# Patient Record
Sex: Male | Born: 1945 | Race: White | Hispanic: No | Marital: Married | Smoking: Never smoker
Health system: Southern US, Community
[De-identification: ages and names within clinical notes are randomized; demographics above are authoritative.]

## PROBLEM LIST (undated history)

## (undated) DIAGNOSIS — F0390 Unspecified dementia without behavioral disturbance: Secondary | ICD-10-CM

## (undated) DIAGNOSIS — Z0000 Encounter for general adult medical examination without abnormal findings: Secondary | ICD-10-CM

## (undated) DIAGNOSIS — E785 Hyperlipidemia, unspecified: Secondary | ICD-10-CM

## (undated) DIAGNOSIS — I10 Essential (primary) hypertension: Secondary | ICD-10-CM

## (undated) DIAGNOSIS — L4050 Arthropathic psoriasis, unspecified: Secondary | ICD-10-CM

## (undated) HISTORY — DX: Hyperlipidemia, unspecified: E78.5

## (undated) HISTORY — DX: Arthropathic psoriasis, unspecified: L40.50

## (undated) HISTORY — DX: Essential (primary) hypertension: I10

## (undated) HISTORY — DX: Encounter for general adult medical examination without abnormal findings: Z00.00

## (undated) HISTORY — PX: NO PAST SURGERIES: SHX2092

## (undated) HISTORY — PX: CATARACT EXTRACTION, BILATERAL: SHX1313

---

## 1997-05-20 ENCOUNTER — Ambulatory Visit (HOSPITAL_COMMUNITY): Admission: RE | Admit: 1997-05-20 | Discharge: 1997-05-20 | Payer: Self-pay | Admitting: Family Medicine

## 2000-10-04 ENCOUNTER — Other Ambulatory Visit: Admission: RE | Admit: 2000-10-04 | Discharge: 2000-10-04 | Payer: Self-pay | Admitting: Urology

## 2000-10-29 ENCOUNTER — Encounter (INDEPENDENT_AMBULATORY_CARE_PROVIDER_SITE_OTHER): Payer: Self-pay

## 2000-10-29 ENCOUNTER — Ambulatory Visit (HOSPITAL_BASED_OUTPATIENT_CLINIC_OR_DEPARTMENT_OTHER): Admission: RE | Admit: 2000-10-29 | Discharge: 2000-10-29 | Payer: Self-pay | Admitting: Urology

## 2002-01-13 ENCOUNTER — Encounter: Admission: RE | Admit: 2002-01-13 | Discharge: 2002-01-13 | Payer: Self-pay | Admitting: Otolaryngology

## 2002-01-13 ENCOUNTER — Encounter: Payer: Self-pay | Admitting: Otolaryngology

## 2009-05-12 ENCOUNTER — Emergency Department (HOSPITAL_COMMUNITY): Admission: EM | Admit: 2009-05-12 | Discharge: 2009-05-12 | Payer: Self-pay | Admitting: Emergency Medicine

## 2010-05-19 NOTE — Op Note (Signed)
[facility]  Patient:    Douglas Reynolds, Douglas Reynolds Visit Number: [id number] MRN: [id number]          Service Type: NES Location: [facility] Attending Physician:  Evlyn Clines Dictated by:   Excell Seltzer. Annabell Howells, M.D. Proc. Date: 10/29/00 Admit Date:  10/29/2000   CC:         Gloriajean Dell. Andrey Campanile, M.D.                           Operative Report  PREOPERATIVE DIAGNOSIS:  Microhematuria with positive cytologies.  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  PROCEDURE:  Cystoscopy with bilateral retrograde pyelograms with interpretation, right and left renal pelvic washings for cytology, _______ bladder cytology, random cup bladder and prostatic urethral biopsies with fulguration.  SURGEON:  Excell Seltzer. Annabell Howells, M.D.  ANESTHESIA:  General.  SPECIMEN:  Washings from the right and left renal pelvis and bladder, cup biopsies from the bladder wall dome, posterior wall, right and left lateral walls, and trigone.  Cup biopsies from the prostatic urethra.  COMPLICATIONS:  None.  INDICATIONS:  The patient is a 65 year old white male who originally was sent for microhematuria.  An office workup with IVP and cystoscopy were negative, but he had two cytologies, one with highly atypical cells suspicious for urothelial carcinoma.  The other one was positive for malignant transitional cells.  It was felt that a more thorough evaluation was indicated.  FINDINGS AND DESCRIPTION OF PROCEDURE:  The patient was taken to the operating room.  He received Tequin preoperatively.  General anesthetic was induced.  He was placed in the lithotomy position.  His perineum and genitalia were prepped with Betadine solution.  He was draped in the usual sterile fashion. Cystoscopy was performed using a 22-French scope, and the 12 and 70 degree lenses.  Examination revealed a normal anterior urethra.  In the bulb, there was a mild stricture without  sufficient constriction to impact flow.  The 22-French scope passed easily.  There was no evidence of neoplastic changes proximal or distal to the stricture.  The external sphincter was intact.  The prostatic urethra was short with bilobar hyperplasia without significant obstruction.  No mucosal abnormalities were noted in the prostatic urethra. The bladder wall was smooth and pale without tumors, stones, or inflammation. The ureteral orifices were in their normal anatomic position effluxing clear urine.  The right ureteral orifice was cannulated with a 5-French open-ended catheter.  Contrast was instilled in a retrograde fashion.  This retrograde demonstrated no abnormalities of the ureter or intrarenal collecting system.  After completion of the retrograde, the 5-French catheter was advanced to the renal pelvis.  The contrast was rinsed from the renal pelvis and normal saline was used to perform washings.  Approximately 25-30 cc of fluid was retrieved. The catheter used for the right washings and retrograde along with the syringe was discarded.  A fresh catheter was used on the left where a retrograde pyelogram was performed.  This study revealed a normal ureter and intrarenal collecting system without filling defects or other abnormalities.  The 5-French catheter was advanced into the renal pelvis, and the contrast was irrigated from the renal pelvis, and then washings were performed with normal saline, approximately 20-25 cc of fluid was retrieved for cytology.  Once the left side studies had been completed, that catheter and syringe were discarded.  I then rinsed out the bladder several times with normal saline, and  then performed bladder wash cytologies with normal saline.  At this point, the cup biopsy forceps was then introduced, and biopsies were obtained from the right and left lateral walls of the bladder, the posterior wall of the bladder, the dome of the bladder, and the  trigone.  These biopsy sites were fulgurated with the Bugbee electrode.  Prostatic urethral biopsies were then taken from the right and left prostatic lobes.  Those biopsy sites were also fulgurated.  At this point, final inspection revealed good hemostasis.  The bladder was drained.  The scope was removed and the patient was taken down from the lithotomy position.  His anesthetic was reversed.  He was moved to the recovery room in stable condition.  There were no complications. Dictated by:   Excell Seltzer. Annabell Howells, M.D. Attending Physician:  Evlyn Clines DD:  10/29/00 TD:  10/30/00 Job: [id number]

## 2011-03-27 ENCOUNTER — Ambulatory Visit (INDEPENDENT_AMBULATORY_CARE_PROVIDER_SITE_OTHER): Payer: Medicare Other | Admitting: Family Medicine

## 2011-03-27 ENCOUNTER — Encounter: Payer: Self-pay | Admitting: Family Medicine

## 2011-03-27 VITALS — BP 146/80 | Ht 70.0 in | Wt 190.0 lb

## 2011-03-27 DIAGNOSIS — L4050 Arthropathic psoriasis, unspecified: Secondary | ICD-10-CM

## 2011-03-27 DIAGNOSIS — S43429A Sprain of unspecified rotator cuff capsule, initial encounter: Secondary | ICD-10-CM

## 2011-03-27 DIAGNOSIS — E785 Hyperlipidemia, unspecified: Secondary | ICD-10-CM

## 2011-03-27 DIAGNOSIS — M75101 Unspecified rotator cuff tear or rupture of right shoulder, not specified as traumatic: Secondary | ICD-10-CM

## 2011-03-27 DIAGNOSIS — I10 Essential (primary) hypertension: Secondary | ICD-10-CM

## 2011-03-27 HISTORY — DX: Hyperlipidemia, unspecified: E78.5

## 2011-03-27 HISTORY — DX: Arthropathic psoriasis, unspecified: L40.50

## 2011-03-27 HISTORY — DX: Essential (primary) hypertension: I10

## 2011-03-27 NOTE — Progress Notes (Signed)
Patient Name: Douglas Reynolds Date of Birth: December 19, 1945 Medical Record Number: [id number] Gender: male Date of Encounter: 03/27/2011  History of Present Illness:  Douglas Reynolds is a 66 y.o. very pleasant male patient who presents with the following:  Dr. 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 At that time he was jostled very much, in his neck hurt for several days after this.  Subsequently he developed some right-sided shoulder pain a week or so after this incident. He is not clear whether or not he particularly jerky shoulder are not at the time, but his recollection is unclear due to the fall.  At this point he has quite significant lateral shoulder pain in a tee shirt distribution and a very significant lack of ability to abduct the shoulder. He also has loss of motion in multiple planes of movement.   Clinical history is also significant for a history of psoriatic arthritis, on Imuran, and taking prednisone 2.5 mg daily.  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  Patient Active Problem List  Diagnoses  . Psoriatic arthritis  . Hyperlipidemia  . Hypertension   Past Medical History  Diagnosis Date  . Psoriatic arthritis 03/27/2011  . Hyperlipidemia 03/27/2011  . Hypertension 03/27/2011   No past surgical history on file. History  Substance Use Topics  . Smoking status: Not on file  . Smokeless tobacco: Not on file  . Alcohol Use: Not on file   No family history on file. Allergies no known allergies  Medication list has been reviewed and updated.  Review of Systems:  GEN: No fevers, chills. Nontoxic. Primarily MSK c/o today. MSK: Detailed in the HPI GI: tolerating PO intake without difficulty Neuro: No numbness, parasthesias, or  tingling associated. Otherwise the pertinent positives of the ROS are noted above.    Physical Examination: Filed Vitals:   03/27/11 1355  BP: 146/80  Height: 5\' 10"  (1.778 m)  Weight: 190 lb (86.183 kg)    Body mass index is 27.26 kg/(m^2).   GEN: WDWN, NAD, Non-toxic, Alert & Oriented x 3 HEENT: Atraumatic, Normocephalic.  Ears and Nose: No external deformity. EXTR: No clubbing/cyanosis/edema NEURO: Normal gait.  PSYCH: Normally interactive. Conversant. Not depressed or anxious appearing.  Calm demeanor.   Cervical spine motion is full without inducible radiculopathy.  RIGHT shoulder: Nontender lung clavicle. Nontender at the a.c. Joint. Nontender in the bicipital groove.  Abduction is limited to 80. Flexion to 110. Internal and external rotation is also significantly limited, but not able to be evaluated fully at 90 abduction.  str testing is 4/5 in abd 5/5 in IROM and EROM  Drop test is negative. Other rotator cuff special testing is not possible.  Diagnostic Ultrasound Evalution: [employer] E, MSK ultrasound, MSK probe Anatomy scanned: R Shoulder Indication: Pain Findings: the biceps is intact on shorter long axis, and maintains its position in the bicipital groove. Subscapularis is intact without any evidence of tearing. There is no subcoracoid impingement. Supraspinatus evaluation shows a full-thickness tear in the body of the supraspinatus. The footprint appears to be intact, but there is evidence of a large tear in multiple views. Images are saved.   Assessment and Plan:  1. Rotator cuff tear, right   2. Psoriatic arthritis   3. Hyperlipidemia  4. Hypertension     Examination and ultrasound findings are consistent with a full-thickness supraspinatus tear.  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  We are going to consult Dr.  Dion Saucier for his expertise.

## 2011-03-27 NOTE — Patient Instructions (Signed)
You have been scheduled for an appointment with Eulah Pont and [facility] on 03/29/11 at 2:30 pm.  Their office is located at [address] [place] [address], phone number is [phone].

## 2011-03-29 ENCOUNTER — Other Ambulatory Visit: Payer: Self-pay | Admitting: Orthopedic Surgery

## 2011-03-29 DIAGNOSIS — M25511 Pain in right shoulder: Secondary | ICD-10-CM

## 2011-04-09 ENCOUNTER — Ambulatory Visit
Admission: RE | Admit: 2011-04-09 | Discharge: 2011-04-09 | Disposition: A | Discharge status: Home / Self Care | Payer: Medicare Other | Source: Ambulatory Visit | Attending: Orthopedic Surgery | Admitting: Orthopedic Surgery

## 2011-04-09 DIAGNOSIS — M25511 Pain in right shoulder: Secondary | ICD-10-CM

## 2015-01-28 DIAGNOSIS — M8580 Other specified disorders of bone density and structure, unspecified site: Secondary | ICD-10-CM | POA: Diagnosis not present

## 2015-01-28 DIAGNOSIS — M5136 Other intervertebral disc degeneration, lumbar region: Secondary | ICD-10-CM | POA: Diagnosis not present

## 2015-01-28 DIAGNOSIS — L4050 Arthropathic psoriasis, unspecified: Secondary | ICD-10-CM | POA: Diagnosis not present

## 2015-01-28 DIAGNOSIS — M25569 Pain in unspecified knee: Secondary | ICD-10-CM | POA: Diagnosis not present

## 2015-01-28 DIAGNOSIS — L401 Generalized pustular psoriasis: Secondary | ICD-10-CM | POA: Diagnosis not present

## 2015-04-28 DIAGNOSIS — L4050 Arthropathic psoriasis, unspecified: Secondary | ICD-10-CM | POA: Diagnosis not present

## 2015-04-28 DIAGNOSIS — M5136 Other intervertebral disc degeneration, lumbar region: Secondary | ICD-10-CM | POA: Diagnosis not present

## 2015-04-28 DIAGNOSIS — L401 Generalized pustular psoriasis: Secondary | ICD-10-CM | POA: Diagnosis not present

## 2015-04-28 DIAGNOSIS — M8580 Other specified disorders of bone density and structure, unspecified site: Secondary | ICD-10-CM | POA: Diagnosis not present

## 2015-07-21 DIAGNOSIS — X32XXXD Exposure to sunlight, subsequent encounter: Secondary | ICD-10-CM | POA: Diagnosis not present

## 2015-07-21 DIAGNOSIS — C44529 Squamous cell carcinoma of skin of other part of trunk: Secondary | ICD-10-CM | POA: Diagnosis not present

## 2015-07-21 DIAGNOSIS — D225 Melanocytic nevi of trunk: Secondary | ICD-10-CM | POA: Diagnosis not present

## 2015-07-21 DIAGNOSIS — C44622 Squamous cell carcinoma of skin of right upper limb, including shoulder: Secondary | ICD-10-CM | POA: Diagnosis not present

## 2015-07-21 DIAGNOSIS — L570 Actinic keratosis: Secondary | ICD-10-CM | POA: Diagnosis not present

## 2015-07-28 DIAGNOSIS — M8580 Other specified disorders of bone density and structure, unspecified site: Secondary | ICD-10-CM | POA: Diagnosis not present

## 2015-07-28 DIAGNOSIS — L4050 Arthropathic psoriasis, unspecified: Secondary | ICD-10-CM | POA: Diagnosis not present

## 2015-07-28 DIAGNOSIS — L401 Generalized pustular psoriasis: Secondary | ICD-10-CM | POA: Diagnosis not present

## 2015-07-28 DIAGNOSIS — M5136 Other intervertebral disc degeneration, lumbar region: Secondary | ICD-10-CM | POA: Diagnosis not present

## 2015-08-04 DIAGNOSIS — R5383 Other fatigue: Secondary | ICD-10-CM | POA: Diagnosis not present

## 2015-08-04 DIAGNOSIS — R5381 Other malaise: Secondary | ICD-10-CM | POA: Diagnosis not present

## 2015-08-04 DIAGNOSIS — E7800 Pure hypercholesterolemia, unspecified: Secondary | ICD-10-CM | POA: Diagnosis not present

## 2015-08-04 DIAGNOSIS — W57XXXA Bitten or stung by nonvenomous insect and other nonvenomous arthropods, initial encounter: Secondary | ICD-10-CM | POA: Diagnosis not present

## 2015-08-04 DIAGNOSIS — R51 Headache: Secondary | ICD-10-CM | POA: Diagnosis not present

## 2015-08-04 DIAGNOSIS — I10 Essential (primary) hypertension: Secondary | ICD-10-CM | POA: Diagnosis not present

## 2015-08-04 DIAGNOSIS — M2550 Pain in unspecified joint: Secondary | ICD-10-CM | POA: Diagnosis not present

## 2015-08-04 DIAGNOSIS — T148 Other injury of unspecified body region: Secondary | ICD-10-CM | POA: Diagnosis not present

## 2015-08-08 DIAGNOSIS — Z08 Encounter for follow-up examination after completed treatment for malignant neoplasm: Secondary | ICD-10-CM | POA: Diagnosis not present

## 2015-08-08 DIAGNOSIS — X32XXXD Exposure to sunlight, subsequent encounter: Secondary | ICD-10-CM | POA: Diagnosis not present

## 2015-08-08 DIAGNOSIS — C44529 Squamous cell carcinoma of skin of other part of trunk: Secondary | ICD-10-CM | POA: Diagnosis not present

## 2015-08-08 DIAGNOSIS — B078 Other viral warts: Secondary | ICD-10-CM | POA: Diagnosis not present

## 2015-08-08 DIAGNOSIS — L570 Actinic keratosis: Secondary | ICD-10-CM | POA: Diagnosis not present

## 2015-08-08 DIAGNOSIS — Z85828 Personal history of other malignant neoplasm of skin: Secondary | ICD-10-CM | POA: Diagnosis not present

## 2015-08-17 DIAGNOSIS — C801 Malignant (primary) neoplasm, unspecified: Secondary | ICD-10-CM | POA: Diagnosis not present

## 2015-08-17 DIAGNOSIS — R5383 Other fatigue: Secondary | ICD-10-CM | POA: Diagnosis not present

## 2015-08-17 DIAGNOSIS — R5381 Other malaise: Secondary | ICD-10-CM | POA: Diagnosis not present

## 2015-08-17 DIAGNOSIS — C44529 Squamous cell carcinoma of skin of other part of trunk: Secondary | ICD-10-CM | POA: Diagnosis not present

## 2015-09-08 DIAGNOSIS — L570 Actinic keratosis: Secondary | ICD-10-CM | POA: Diagnosis not present

## 2015-09-08 DIAGNOSIS — X32XXXD Exposure to sunlight, subsequent encounter: Secondary | ICD-10-CM | POA: Diagnosis not present

## 2015-09-08 DIAGNOSIS — Z85828 Personal history of other malignant neoplasm of skin: Secondary | ICD-10-CM | POA: Diagnosis not present

## 2015-09-08 DIAGNOSIS — Z08 Encounter for follow-up examination after completed treatment for malignant neoplasm: Secondary | ICD-10-CM | POA: Diagnosis not present

## 2015-09-26 DIAGNOSIS — L4052 Psoriatic arthritis mutilans: Secondary | ICD-10-CM | POA: Diagnosis not present

## 2015-09-26 DIAGNOSIS — E785 Hyperlipidemia, unspecified: Secondary | ICD-10-CM | POA: Diagnosis not present

## 2015-09-26 DIAGNOSIS — Z23 Encounter for immunization: Secondary | ICD-10-CM | POA: Diagnosis not present

## 2015-09-26 DIAGNOSIS — I10 Essential (primary) hypertension: Secondary | ICD-10-CM | POA: Diagnosis not present

## 2015-10-28 DIAGNOSIS — L4050 Arthropathic psoriasis, unspecified: Secondary | ICD-10-CM | POA: Diagnosis not present

## 2015-10-28 DIAGNOSIS — M5136 Other intervertebral disc degeneration, lumbar region: Secondary | ICD-10-CM | POA: Diagnosis not present

## 2015-10-28 DIAGNOSIS — L401 Generalized pustular psoriasis: Secondary | ICD-10-CM | POA: Diagnosis not present

## 2015-10-28 DIAGNOSIS — M8580 Other specified disorders of bone density and structure, unspecified site: Secondary | ICD-10-CM | POA: Diagnosis not present

## 2015-11-10 DIAGNOSIS — Z85828 Personal history of other malignant neoplasm of skin: Secondary | ICD-10-CM | POA: Diagnosis not present

## 2015-11-10 DIAGNOSIS — C44622 Squamous cell carcinoma of skin of right upper limb, including shoulder: Secondary | ICD-10-CM | POA: Diagnosis not present

## 2015-11-10 DIAGNOSIS — X32XXXD Exposure to sunlight, subsequent encounter: Secondary | ICD-10-CM | POA: Diagnosis not present

## 2015-11-10 DIAGNOSIS — D225 Melanocytic nevi of trunk: Secondary | ICD-10-CM | POA: Diagnosis not present

## 2015-11-10 DIAGNOSIS — Z08 Encounter for follow-up examination after completed treatment for malignant neoplasm: Secondary | ICD-10-CM | POA: Diagnosis not present

## 2015-11-10 DIAGNOSIS — L570 Actinic keratosis: Secondary | ICD-10-CM | POA: Diagnosis not present

## 2016-01-05 DIAGNOSIS — X32XXXD Exposure to sunlight, subsequent encounter: Secondary | ICD-10-CM | POA: Diagnosis not present

## 2016-01-05 DIAGNOSIS — Z08 Encounter for follow-up examination after completed treatment for malignant neoplasm: Secondary | ICD-10-CM | POA: Diagnosis not present

## 2016-01-05 DIAGNOSIS — C4441 Basal cell carcinoma of skin of scalp and neck: Secondary | ICD-10-CM | POA: Diagnosis not present

## 2016-01-05 DIAGNOSIS — L570 Actinic keratosis: Secondary | ICD-10-CM | POA: Diagnosis not present

## 2016-01-05 DIAGNOSIS — Z85828 Personal history of other malignant neoplasm of skin: Secondary | ICD-10-CM | POA: Diagnosis not present

## 2016-01-05 DIAGNOSIS — D0461 Carcinoma in situ of skin of right upper limb, including shoulder: Secondary | ICD-10-CM | POA: Diagnosis not present

## 2016-01-25 DIAGNOSIS — I10 Essential (primary) hypertension: Secondary | ICD-10-CM | POA: Diagnosis not present

## 2016-01-25 DIAGNOSIS — Z125 Encounter for screening for malignant neoplasm of prostate: Secondary | ICD-10-CM | POA: Diagnosis not present

## 2016-01-25 DIAGNOSIS — E785 Hyperlipidemia, unspecified: Secondary | ICD-10-CM | POA: Diagnosis not present

## 2016-01-27 DIAGNOSIS — I10 Essential (primary) hypertension: Secondary | ICD-10-CM | POA: Diagnosis not present

## 2016-01-27 DIAGNOSIS — E785 Hyperlipidemia, unspecified: Secondary | ICD-10-CM | POA: Diagnosis not present

## 2016-01-27 DIAGNOSIS — L4052 Psoriatic arthritis mutilans: Secondary | ICD-10-CM | POA: Diagnosis not present

## 2016-01-27 DIAGNOSIS — Z23 Encounter for immunization: Secondary | ICD-10-CM | POA: Diagnosis not present

## 2016-01-27 DIAGNOSIS — M545 Low back pain: Secondary | ICD-10-CM | POA: Diagnosis not present

## 2016-01-27 DIAGNOSIS — Z0001 Encounter for general adult medical examination with abnormal findings: Secondary | ICD-10-CM | POA: Diagnosis not present

## 2016-01-30 DIAGNOSIS — L4050 Arthropathic psoriasis, unspecified: Secondary | ICD-10-CM | POA: Diagnosis not present

## 2016-01-30 DIAGNOSIS — E663 Overweight: Secondary | ICD-10-CM | POA: Diagnosis not present

## 2016-01-30 DIAGNOSIS — L401 Generalized pustular psoriasis: Secondary | ICD-10-CM | POA: Diagnosis not present

## 2016-01-30 DIAGNOSIS — M8580 Other specified disorders of bone density and structure, unspecified site: Secondary | ICD-10-CM | POA: Diagnosis not present

## 2016-01-30 DIAGNOSIS — Z6827 Body mass index (BMI) 27.0-27.9, adult: Secondary | ICD-10-CM | POA: Diagnosis not present

## 2016-01-30 DIAGNOSIS — M5136 Other intervertebral disc degeneration, lumbar region: Secondary | ICD-10-CM | POA: Diagnosis not present

## 2016-02-06 DIAGNOSIS — L570 Actinic keratosis: Secondary | ICD-10-CM | POA: Diagnosis not present

## 2016-02-06 DIAGNOSIS — Z08 Encounter for follow-up examination after completed treatment for malignant neoplasm: Secondary | ICD-10-CM | POA: Diagnosis not present

## 2016-02-06 DIAGNOSIS — Z85828 Personal history of other malignant neoplasm of skin: Secondary | ICD-10-CM | POA: Diagnosis not present

## 2016-02-06 DIAGNOSIS — X32XXXD Exposure to sunlight, subsequent encounter: Secondary | ICD-10-CM | POA: Diagnosis not present

## 2016-02-06 DIAGNOSIS — D0461 Carcinoma in situ of skin of right upper limb, including shoulder: Secondary | ICD-10-CM | POA: Diagnosis not present

## 2016-04-30 DIAGNOSIS — M5136 Other intervertebral disc degeneration, lumbar region: Secondary | ICD-10-CM | POA: Diagnosis not present

## 2016-04-30 DIAGNOSIS — Z6827 Body mass index (BMI) 27.0-27.9, adult: Secondary | ICD-10-CM | POA: Diagnosis not present

## 2016-04-30 DIAGNOSIS — M8580 Other specified disorders of bone density and structure, unspecified site: Secondary | ICD-10-CM | POA: Diagnosis not present

## 2016-04-30 DIAGNOSIS — E663 Overweight: Secondary | ICD-10-CM | POA: Diagnosis not present

## 2016-04-30 DIAGNOSIS — L401 Generalized pustular psoriasis: Secondary | ICD-10-CM | POA: Diagnosis not present

## 2016-04-30 DIAGNOSIS — L4050 Arthropathic psoriasis, unspecified: Secondary | ICD-10-CM | POA: Diagnosis not present

## 2016-07-25 DIAGNOSIS — I10 Essential (primary) hypertension: Secondary | ICD-10-CM | POA: Diagnosis not present

## 2016-07-27 DIAGNOSIS — M545 Low back pain: Secondary | ICD-10-CM | POA: Diagnosis not present

## 2016-07-27 DIAGNOSIS — Z6825 Body mass index (BMI) 25.0-25.9, adult: Secondary | ICD-10-CM | POA: Diagnosis not present

## 2016-07-27 DIAGNOSIS — E785 Hyperlipidemia, unspecified: Secondary | ICD-10-CM | POA: Diagnosis not present

## 2016-07-27 DIAGNOSIS — L4052 Psoriatic arthritis mutilans: Secondary | ICD-10-CM | POA: Diagnosis not present

## 2016-07-27 DIAGNOSIS — I10 Essential (primary) hypertension: Secondary | ICD-10-CM | POA: Diagnosis not present

## 2016-08-01 DIAGNOSIS — L401 Generalized pustular psoriasis: Secondary | ICD-10-CM | POA: Diagnosis not present

## 2016-08-01 DIAGNOSIS — M8580 Other specified disorders of bone density and structure, unspecified site: Secondary | ICD-10-CM | POA: Diagnosis not present

## 2016-08-01 DIAGNOSIS — M5136 Other intervertebral disc degeneration, lumbar region: Secondary | ICD-10-CM | POA: Diagnosis not present

## 2016-08-01 DIAGNOSIS — L4050 Arthropathic psoriasis, unspecified: Secondary | ICD-10-CM | POA: Diagnosis not present

## 2016-08-01 DIAGNOSIS — E663 Overweight: Secondary | ICD-10-CM | POA: Diagnosis not present

## 2016-08-01 DIAGNOSIS — Z6826 Body mass index (BMI) 26.0-26.9, adult: Secondary | ICD-10-CM | POA: Diagnosis not present

## 2016-08-27 DIAGNOSIS — L570 Actinic keratosis: Secondary | ICD-10-CM | POA: Diagnosis not present

## 2016-08-27 DIAGNOSIS — L821 Other seborrheic keratosis: Secondary | ICD-10-CM | POA: Diagnosis not present

## 2016-08-27 DIAGNOSIS — Z1283 Encounter for screening for malignant neoplasm of skin: Secondary | ICD-10-CM | POA: Diagnosis not present

## 2016-08-27 DIAGNOSIS — X32XXXD Exposure to sunlight, subsequent encounter: Secondary | ICD-10-CM | POA: Diagnosis not present

## 2016-08-27 DIAGNOSIS — C44219 Basal cell carcinoma of skin of left ear and external auricular canal: Secondary | ICD-10-CM | POA: Diagnosis not present

## 2016-09-26 DIAGNOSIS — Z23 Encounter for immunization: Secondary | ICD-10-CM | POA: Diagnosis not present

## 2016-10-08 DIAGNOSIS — X32XXXD Exposure to sunlight, subsequent encounter: Secondary | ICD-10-CM | POA: Diagnosis not present

## 2016-10-08 DIAGNOSIS — B078 Other viral warts: Secondary | ICD-10-CM | POA: Diagnosis not present

## 2016-10-08 DIAGNOSIS — D225 Melanocytic nevi of trunk: Secondary | ICD-10-CM | POA: Diagnosis not present

## 2016-10-08 DIAGNOSIS — Z08 Encounter for follow-up examination after completed treatment for malignant neoplasm: Secondary | ICD-10-CM | POA: Diagnosis not present

## 2016-10-08 DIAGNOSIS — Z85828 Personal history of other malignant neoplasm of skin: Secondary | ICD-10-CM | POA: Diagnosis not present

## 2016-10-08 DIAGNOSIS — L570 Actinic keratosis: Secondary | ICD-10-CM | POA: Diagnosis not present

## 2016-10-29 DIAGNOSIS — E785 Hyperlipidemia, unspecified: Secondary | ICD-10-CM | POA: Diagnosis not present

## 2016-10-29 DIAGNOSIS — I10 Essential (primary) hypertension: Secondary | ICD-10-CM | POA: Diagnosis not present

## 2016-10-31 DIAGNOSIS — M545 Low back pain: Secondary | ICD-10-CM | POA: Diagnosis not present

## 2016-10-31 DIAGNOSIS — I10 Essential (primary) hypertension: Secondary | ICD-10-CM | POA: Diagnosis not present

## 2016-10-31 DIAGNOSIS — L4052 Psoriatic arthritis mutilans: Secondary | ICD-10-CM | POA: Diagnosis not present

## 2016-10-31 DIAGNOSIS — G3184 Mild cognitive impairment, so stated: Secondary | ICD-10-CM | POA: Diagnosis not present

## 2016-11-01 DIAGNOSIS — M8580 Other specified disorders of bone density and structure, unspecified site: Secondary | ICD-10-CM | POA: Diagnosis not present

## 2016-11-01 DIAGNOSIS — E663 Overweight: Secondary | ICD-10-CM | POA: Diagnosis not present

## 2016-11-01 DIAGNOSIS — Z6826 Body mass index (BMI) 26.0-26.9, adult: Secondary | ICD-10-CM | POA: Diagnosis not present

## 2016-11-01 DIAGNOSIS — M5136 Other intervertebral disc degeneration, lumbar region: Secondary | ICD-10-CM | POA: Diagnosis not present

## 2016-11-01 DIAGNOSIS — L401 Generalized pustular psoriasis: Secondary | ICD-10-CM | POA: Diagnosis not present

## 2016-11-01 DIAGNOSIS — L4050 Arthropathic psoriasis, unspecified: Secondary | ICD-10-CM | POA: Diagnosis not present

## 2017-01-24 ENCOUNTER — Telehealth: Payer: Self-pay | Admitting: Audiology

## 2017-01-24 NOTE — Telephone Encounter (Signed)
[facility] does not provide hearing aids. If that is primary reason for visit, follow-up with referent for a hearing aid evaluation and audiogram is recommended.  Deborah L. Heide Spark, Au.D., CCC-A Doctor of Audiology 01/24/2017

## 2017-02-01 DIAGNOSIS — L4050 Arthropathic psoriasis, unspecified: Secondary | ICD-10-CM | POA: Diagnosis not present

## 2017-02-01 DIAGNOSIS — M150 Primary generalized (osteo)arthritis: Secondary | ICD-10-CM | POA: Diagnosis not present

## 2017-02-01 DIAGNOSIS — M5136 Other intervertebral disc degeneration, lumbar region: Secondary | ICD-10-CM | POA: Diagnosis not present

## 2017-02-01 DIAGNOSIS — M8580 Other specified disorders of bone density and structure, unspecified site: Secondary | ICD-10-CM | POA: Diagnosis not present

## 2017-02-01 DIAGNOSIS — L401 Generalized pustular psoriasis: Secondary | ICD-10-CM | POA: Diagnosis not present

## 2017-02-02 DIAGNOSIS — R350 Frequency of micturition: Secondary | ICD-10-CM | POA: Diagnosis not present

## 2017-02-02 DIAGNOSIS — J111 Influenza due to unidentified influenza virus with other respiratory manifestations: Secondary | ICD-10-CM | POA: Diagnosis not present

## 2017-02-20 ENCOUNTER — Ambulatory Visit: Payer: Medicare Other | Admitting: Audiology

## 2017-03-12 DIAGNOSIS — H9313 Tinnitus, bilateral: Secondary | ICD-10-CM | POA: Diagnosis not present

## 2017-03-12 DIAGNOSIS — Z77122 Contact with and (suspected) exposure to noise: Secondary | ICD-10-CM | POA: Diagnosis not present

## 2017-03-12 DIAGNOSIS — H903 Sensorineural hearing loss, bilateral: Secondary | ICD-10-CM | POA: Diagnosis not present

## 2017-03-18 DIAGNOSIS — I10 Essential (primary) hypertension: Secondary | ICD-10-CM | POA: Diagnosis not present

## 2017-03-18 DIAGNOSIS — R350 Frequency of micturition: Secondary | ICD-10-CM | POA: Diagnosis not present

## 2017-03-18 DIAGNOSIS — J111 Influenza due to unidentified influenza virus with other respiratory manifestations: Secondary | ICD-10-CM | POA: Diagnosis not present

## 2017-03-20 DIAGNOSIS — E782 Mixed hyperlipidemia: Secondary | ICD-10-CM | POA: Diagnosis not present

## 2017-03-20 DIAGNOSIS — M545 Low back pain: Secondary | ICD-10-CM | POA: Diagnosis not present

## 2017-03-20 DIAGNOSIS — G3184 Mild cognitive impairment, so stated: Secondary | ICD-10-CM | POA: Diagnosis not present

## 2017-03-20 DIAGNOSIS — Z6827 Body mass index (BMI) 27.0-27.9, adult: Secondary | ICD-10-CM | POA: Diagnosis not present

## 2017-03-20 DIAGNOSIS — I10 Essential (primary) hypertension: Secondary | ICD-10-CM | POA: Diagnosis not present

## 2017-03-20 DIAGNOSIS — L4052 Psoriatic arthritis mutilans: Secondary | ICD-10-CM | POA: Diagnosis not present

## 2017-04-15 DIAGNOSIS — L0202 Furuncle of face: Secondary | ICD-10-CM | POA: Diagnosis not present

## 2017-04-15 DIAGNOSIS — L570 Actinic keratosis: Secondary | ICD-10-CM | POA: Diagnosis not present

## 2017-04-15 DIAGNOSIS — X32XXXD Exposure to sunlight, subsequent encounter: Secondary | ICD-10-CM | POA: Diagnosis not present

## 2017-05-01 DIAGNOSIS — R413 Other amnesia: Secondary | ICD-10-CM | POA: Diagnosis not present

## 2017-05-06 DIAGNOSIS — M25551 Pain in right hip: Secondary | ICD-10-CM | POA: Diagnosis not present

## 2017-05-06 DIAGNOSIS — M5136 Other intervertebral disc degeneration, lumbar region: Secondary | ICD-10-CM | POA: Diagnosis not present

## 2017-05-06 DIAGNOSIS — E663 Overweight: Secondary | ICD-10-CM | POA: Diagnosis not present

## 2017-05-06 DIAGNOSIS — L4050 Arthropathic psoriasis, unspecified: Secondary | ICD-10-CM | POA: Diagnosis not present

## 2017-05-06 DIAGNOSIS — Z6827 Body mass index (BMI) 27.0-27.9, adult: Secondary | ICD-10-CM | POA: Diagnosis not present

## 2017-05-06 DIAGNOSIS — M8580 Other specified disorders of bone density and structure, unspecified site: Secondary | ICD-10-CM | POA: Diagnosis not present

## 2017-05-06 DIAGNOSIS — L401 Generalized pustular psoriasis: Secondary | ICD-10-CM | POA: Diagnosis not present

## 2017-05-06 DIAGNOSIS — M150 Primary generalized (osteo)arthritis: Secondary | ICD-10-CM | POA: Diagnosis not present

## 2017-05-06 DIAGNOSIS — M79675 Pain in left toe(s): Secondary | ICD-10-CM | POA: Diagnosis not present

## 2017-06-21 ENCOUNTER — Encounter (HOSPITAL_COMMUNITY): Payer: Self-pay | Admitting: Emergency Medicine

## 2017-06-21 ENCOUNTER — Emergency Department (HOSPITAL_COMMUNITY)
Admission: EM | Admit: 2017-06-21 | Discharge: 2017-06-21 | Disposition: A | Discharge status: Home / Self Care | Payer: Medicare Other | Attending: Emergency Medicine | Admitting: Emergency Medicine

## 2017-06-21 ENCOUNTER — Other Ambulatory Visit: Payer: Self-pay

## 2017-06-21 DIAGNOSIS — S81812A Laceration without foreign body, left lower leg, initial encounter: Secondary | ICD-10-CM

## 2017-06-21 DIAGNOSIS — Y92017 Garden or yard in single-family (private) house as the place of occurrence of the external cause: Secondary | ICD-10-CM | POA: Insufficient documentation

## 2017-06-21 DIAGNOSIS — Y999 Unspecified external cause status: Secondary | ICD-10-CM | POA: Insufficient documentation

## 2017-06-21 DIAGNOSIS — Z7982 Long term (current) use of aspirin: Secondary | ICD-10-CM | POA: Diagnosis not present

## 2017-06-21 DIAGNOSIS — W293XXA Contact with powered garden and outdoor hand tools and machinery, initial encounter: Secondary | ICD-10-CM | POA: Diagnosis not present

## 2017-06-21 DIAGNOSIS — Y93H9 Activity, other involving exterior property and land maintenance, building and construction: Secondary | ICD-10-CM | POA: Insufficient documentation

## 2017-06-21 DIAGNOSIS — I10 Essential (primary) hypertension: Secondary | ICD-10-CM | POA: Insufficient documentation

## 2017-06-21 DIAGNOSIS — Z79899 Other long term (current) drug therapy: Secondary | ICD-10-CM | POA: Insufficient documentation

## 2017-06-21 DIAGNOSIS — Z23 Encounter for immunization: Secondary | ICD-10-CM | POA: Insufficient documentation

## 2017-06-21 MED ORDER — LIDOCAINE-EPINEPHRINE (PF) 2 %-1:200000 IJ SOLN
INTRAMUSCULAR | Status: AC
  Filled 2017-06-21: qty 20

## 2017-06-21 MED ORDER — CEPHALEXIN 500 MG PO CAPS: 500.0000 mg | ORAL_CAPSULE | Freq: Four times a day (QID) | ORAL | 0 refills | Status: DC

## 2017-06-21 MED ORDER — LIDOCAINE HCL (PF) 2 % IJ SOLN
INTRAMUSCULAR | Status: AC
  Filled 2017-06-21: qty 20

## 2017-06-21 MED ORDER — LIDOCAINE HCL (PF) 2 % IJ SOLN
10.0000 mL | Freq: Once | INTRAMUSCULAR | Status: AC
  Administered 2017-06-21: 10 mL via INTRADERMAL

## 2017-06-21 MED ORDER — HYDROCODONE-ACETAMINOPHEN 5-325 MG PO TABS: 1.0000 | ORAL_TABLET | ORAL | 0 refills | Status: DC | PRN

## 2017-06-21 MED ORDER — TETANUS-DIPHTH-ACELL PERTUSSIS 5-2.5-18.5 LF-MCG/0.5 IM SUSP
0.5000 mL | Freq: Once | INTRAMUSCULAR | Status: AC
  Administered 2017-06-21: 0.5 mL via INTRAMUSCULAR
  Filled 2017-06-21: qty 0.5

## 2017-06-21 MED ORDER — DOUBLE ANTIBIOTIC 500-10000 UNIT/GM EX OINT
TOPICAL_OINTMENT | Freq: Once | CUTANEOUS | Status: AC
  Administered 2017-06-21: 16:00:00 via TOPICAL
  Filled 2017-06-21: qty 3

## 2017-06-21 NOTE — Discharge Instructions (Addendum)
Please leave the dressing intact for the next 24 hours.  After that you may remove the dressing and shower.  Do not soak or submerge your wound while you have stitches in.  Your stitches should be removed in 2 weeks.  Please put antibiotic ointment on the stitches and keep them covered with a Band-Aid.   Please take Ibuprofen (Advil, motrin) and Tylenol (acetaminophen) to relieve your pain.  You may take up to 600 MG (3 pills) of normal strength ibuprofen every 8 hours as needed.  In between doses of ibuprofen you make take tylenol, up to 1,000 mg (two extra strength pills).  Do not take more than 3,000 mg tylenol in a 24 hour period.  Please check all medication labels as many medications such as pain and cold medications may contain tylenol.  Do not drink alcohol while taking these medications.  Do not take other NSAID'S while taking ibuprofen (such as aleve or naproxen).  Please take ibuprofen with food to decrease stomach upset.  You are being prescribed a medication which may make you sleepy. For 24 hours after one dose please do not drive, operate heavy machinery, care for a small child with out another adult present, or perform any activities that may cause harm to you or someone else if you were to fall asleep or be impaired.   You may have diarrhea from the antibiotics.  It is very important that you continue to take the antibiotics even if you get diarrhea unless a medical professional tells you that you may stop taking them.  If you stop too early the bacteria you are being treated for will become stronger and you may need different, more powerful antibiotics that have more side effects and worsening diarrhea.  Please stay well hydrated and consider probiotics as they may decrease the severity of your diarrhea.

## 2017-06-21 NOTE — ED Triage Notes (Signed)
Pt states using chainsaw and "knicked" knee area. Pt states minimal bleeding. Tourniquet in place per pt.

## 2017-06-21 NOTE — ED Notes (Signed)
EH in to assess

## 2017-06-21 NOTE — ED Notes (Signed)
Wound to L outer upper arm   approx 3 inches with various depth noted  Also wound to L outer wrist   Dressing removed for visualization

## 2017-06-21 NOTE — ED Provider Notes (Signed)
[facility] EMERGENCY DEPARTMENT Provider Note   CSN: [id number] Arrival date & time: 06/21/17  1259     History   Chief Complaint Chief Complaint  Patient presents with  . Extremity Laceration    HPI Douglas Reynolds is a 72 y.o. male with a past medical history of hypertension, hyperlipidemia, psoriatic arthritis, who presents today for evaluation of a laceration.  He was cutting with his chainsaw up high when his arms got tired causing the chainsaw to hit the lateral left knee.  He denies any other injuries.  Does not take blood thinning medications.   HPI  Past Medical History:  Diagnosis Date  . Hyperlipidemia 03/27/2011  . Hypertension 03/27/2011  . Psoriatic arthritis ([place]) 03/27/2011    Patient Active Problem List   Diagnosis Date Noted  . Psoriatic arthritis ([place]) 03/27/2011  . Hyperlipidemia 03/27/2011  . Hypertension 03/27/2011    History reviewed. No pertinent surgical history.      Home Medications    Prior to Admission medications   Medication Sig Start Date End Date Taking? Authorizing Provider  aspirin 81 MG tablet Take 81 mg by mouth daily.    [provider]  azaTHIOprine (IMURAN) 50 MG tablet Take 50 mg by mouth 3 (three) times daily.    [provider]  Calcium Carbonate (CALCIUM 600 PO) Take by mouth.    [provider]  cephALEXin (KEFLEX) 500 MG capsule Take 1 capsule (500 mg total) by mouth 4 (four) times daily. 06/21/17   Lorin Glass, PA-C  Cholecalciferol (D3 ADULT) 1000 UNITS CHEW Chew by mouth.    [provider]  HYDROcodone-acetaminophen (NORCO/VICODIN) 5-325 MG tablet Take 1 tablet by mouth every 4 (four) hours as needed. 06/21/17   Lorin Glass, PA-C  lisinopril-hydrochlorothiazide (PRINZIDE,ZESTORETIC) 20-25 MG per tablet Take 1 tablet by mouth daily.    [provider]  Multiple Vitamins-Minerals (CENTRUM PO) Take by mouth.    [provider]  omega-3 fish oil  (MAXEPA) 1000 MG CAPS capsule Take by mouth.    [provider]  predniSONE (DELTASONE) 2.5 MG tablet Take 2.5 mg by mouth daily.    [provider]  simvastatin (ZOCOR) 40 MG tablet Take 40 mg by mouth every evening.    [provider]    Family History History reviewed. No pertinent family history.  Social History Social History   Tobacco Use  . Smoking status: Never Smoker  . Smokeless tobacco: Never Used  Substance Use Topics  . Alcohol use: Never    Frequency: Never  . Drug use: Never     Allergies   Patient has no known allergies.   Review of Systems Review of Systems  Constitutional: Negative for chills and fever.  Skin: Positive for wound. Negative for pallor and rash.  Neurological: Negative for weakness and numbness.     Physical Exam Updated Vital Signs BP (!) 167/66 (BP Location: Right Arm)   Pulse (!) 102   Temp 98.4 F (36.9 C) (Oral)   Resp 18   Ht 5\' 10"  (1.778 m)   Wt 80.3 kg (177 lb)   SpO2 100%   BMI 25.40 kg/m   Physical Exam  Constitutional: He appears well-developed and well-nourished.  HENT:  Head: Normocephalic and atraumatic.  Cardiovascular: Intact distal pulses.  Left foot is warm and well perfused.  Musculoskeletal: Normal range of motion.  5/5 strength in left leg.  Neurological: He is alert. No sensory deficit (Sensation intact to left lower  leg.).  Skin: He is not diaphoretic.  There is a 3 cm laceration to the lateral aspect of the left knee.  Bleeding is controlled.  Does not appear to be through the fascia.  No obvious foreign bodies noted.  Nursing note and vitals reviewed.        ED Treatments / Results  Labs (all labs ordered are listed, but only abnormal results are displayed) Labs Reviewed - No data to display  EKG None  Radiology No results found.  Procedures .Marland KitchenLaceration Repair Date/Time: 06/21/2017 3:09 PM Performed by: Lorin Glass, PA-C Authorized by: Lorin Glass, PA-C   Consent:    Consent obtained:  Verbal   Consent given by:  Patient   Risks discussed:  Infection, need for additional repair, poor cosmetic result, pain, retained foreign body, tendon damage, vascular damage, poor wound healing and nerve damage   Alternatives discussed:  No treatment and referral (Alternative wound closures) Anesthesia (see MAR for exact dosages):    Anesthesia method:  Local infiltration   Local anesthetic:  Lidocaine 2% WITH epi and lidocaine 2% w/o epi Laceration details:    Location: Left lateral knee.   Length (cm):  5 Repair type:    Repair type:  Intermediate Pre-procedure details:    Preparation:  Patient was prepped and draped in usual sterile fashion Exploration:    Hemostasis achieved with:  Direct pressure and epinephrine   Wound exploration: wound explored through full range of motion and entire depth of wound probed and visualized     Wound extent: no fascia violation noted, no foreign bodies/material noted and no muscle damage noted     Contaminated: yes   Treatment:    Area cleansed with:  Saline   Amount of cleaning:  Extensive   Irrigation solution:  Sterile saline   Irrigation method:  Pressure wash Skin repair:    Repair method:  Sutures   Suture size:  4-0   Suture material:  Prolene   Suture technique:  Horizontal mattress, simple interrupted and vertical mattress   Number of sutures:  7 Approximation:    Approximation:  Close Post-procedure details:    Dressing:  Antibiotic ointment, non-adherent dressing and bulky dressing   Patient tolerance of procedure:  Tolerated well, no immediate complications   (including critical care time)  Medications Ordered in ED Medications  lidocaine-EPINEPHrine (XYLOCAINE W/EPI) 2 %-1:200000 (PF) injection (has no administration in time range)  polymixin-bacitracin (POLYSPORIN) ointment (has no administration in time range)  lidocaine (XYLOCAINE) 2 % injection 10 mL (10 mLs  Intradermal Given 06/21/17 1324)  Tdap (BOOSTRIX) injection 0.5 mL (0.5 mLs Intramuscular Given 06/21/17 1354)     Initial Impression / Assessment and Plan / ED Course  I have reviewed the triage vital signs and the nursing notes.  Pertinent labs & imaging results that were available during my care of the patient were reviewed by me and considered in my medical decision making (see chart for details).    Pressure irrigation performed. Wound explored and base of wound visualized in a bloodless field without evidence of foreign body.  Laceration occurred < 8 hours prior to repair which was well tolerated.  Tdap updated.  Pt has  no comorbidities to effect normal wound healing. Pt discharged with antibiotics.  Discussed suture home care with patient and answered questions. Pt to follow-up for wound check on Monday and suture removal in 14 days; they are to return to the ED sooner for signs of infection. Pt  is hemodynamically stable with no complaints prior to dc.    Final Clinical Impressions(s) / ED Diagnoses   Final diagnoses:  Contact with chainsaw as cause of accidental injury  Laceration of left lower extremity, initial encounter    ED Discharge Orders        Ordered    cephALEXin (KEFLEX) 500 MG capsule  4 times daily     06/21/17 1506    HYDROcodone-acetaminophen (NORCO/VICODIN) 5-325 MG tablet  Every 4 hours PRN     06/21/17 1506       Lorin Glass, [place] 06/21/17 1511    Davonna Belling, MD 06/21/17 [phone]

## 2017-06-24 DIAGNOSIS — S81802D Unspecified open wound, left lower leg, subsequent encounter: Secondary | ICD-10-CM | POA: Diagnosis not present

## 2017-06-24 DIAGNOSIS — Z6826 Body mass index (BMI) 26.0-26.9, adult: Secondary | ICD-10-CM | POA: Diagnosis not present

## 2017-07-05 DIAGNOSIS — Z4802 Encounter for removal of sutures: Secondary | ICD-10-CM | POA: Diagnosis not present

## 2017-07-05 DIAGNOSIS — S81002D Unspecified open wound, left knee, subsequent encounter: Secondary | ICD-10-CM | POA: Diagnosis not present

## 2017-08-15 DIAGNOSIS — M5136 Other intervertebral disc degeneration, lumbar region: Secondary | ICD-10-CM | POA: Diagnosis not present

## 2017-08-15 DIAGNOSIS — M150 Primary generalized (osteo)arthritis: Secondary | ICD-10-CM | POA: Diagnosis not present

## 2017-08-15 DIAGNOSIS — L401 Generalized pustular psoriasis: Secondary | ICD-10-CM | POA: Diagnosis not present

## 2017-08-15 DIAGNOSIS — M25551 Pain in right hip: Secondary | ICD-10-CM | POA: Diagnosis not present

## 2017-08-15 DIAGNOSIS — M79675 Pain in left toe(s): Secondary | ICD-10-CM | POA: Diagnosis not present

## 2017-08-15 DIAGNOSIS — M8580 Other specified disorders of bone density and structure, unspecified site: Secondary | ICD-10-CM | POA: Diagnosis not present

## 2017-08-15 DIAGNOSIS — Z6826 Body mass index (BMI) 26.0-26.9, adult: Secondary | ICD-10-CM | POA: Diagnosis not present

## 2017-08-15 DIAGNOSIS — E663 Overweight: Secondary | ICD-10-CM | POA: Diagnosis not present

## 2017-08-15 DIAGNOSIS — L4050 Arthropathic psoriasis, unspecified: Secondary | ICD-10-CM | POA: Diagnosis not present

## 2017-09-16 DIAGNOSIS — Z6827 Body mass index (BMI) 27.0-27.9, adult: Secondary | ICD-10-CM | POA: Diagnosis not present

## 2017-09-16 DIAGNOSIS — L4052 Psoriatic arthritis mutilans: Secondary | ICD-10-CM | POA: Diagnosis not present

## 2017-09-16 DIAGNOSIS — J111 Influenza due to unidentified influenza virus with other respiratory manifestations: Secondary | ICD-10-CM | POA: Diagnosis not present

## 2017-09-16 DIAGNOSIS — I10 Essential (primary) hypertension: Secondary | ICD-10-CM | POA: Diagnosis not present

## 2017-09-16 DIAGNOSIS — E782 Mixed hyperlipidemia: Secondary | ICD-10-CM | POA: Diagnosis not present

## 2017-09-16 DIAGNOSIS — S81802D Unspecified open wound, left lower leg, subsequent encounter: Secondary | ICD-10-CM | POA: Diagnosis not present

## 2017-09-16 DIAGNOSIS — S81002D Unspecified open wound, left knee, subsequent encounter: Secondary | ICD-10-CM | POA: Diagnosis not present

## 2017-09-16 DIAGNOSIS — Z6826 Body mass index (BMI) 26.0-26.9, adult: Secondary | ICD-10-CM | POA: Diagnosis not present

## 2017-09-16 DIAGNOSIS — M545 Low back pain: Secondary | ICD-10-CM | POA: Diagnosis not present

## 2017-09-16 DIAGNOSIS — R350 Frequency of micturition: Secondary | ICD-10-CM | POA: Diagnosis not present

## 2017-09-16 DIAGNOSIS — G3184 Mild cognitive impairment, so stated: Secondary | ICD-10-CM | POA: Diagnosis not present

## 2017-09-16 DIAGNOSIS — Z4802 Encounter for removal of sutures: Secondary | ICD-10-CM | POA: Diagnosis not present

## 2017-09-20 DIAGNOSIS — Z6825 Body mass index (BMI) 25.0-25.9, adult: Secondary | ICD-10-CM | POA: Diagnosis not present

## 2017-09-20 DIAGNOSIS — I10 Essential (primary) hypertension: Secondary | ICD-10-CM | POA: Diagnosis not present

## 2017-09-20 DIAGNOSIS — G3184 Mild cognitive impairment, so stated: Secondary | ICD-10-CM | POA: Diagnosis not present

## 2017-09-20 DIAGNOSIS — M545 Low back pain: Secondary | ICD-10-CM | POA: Diagnosis not present

## 2017-09-20 DIAGNOSIS — L4052 Psoriatic arthritis mutilans: Secondary | ICD-10-CM | POA: Diagnosis not present

## 2017-09-20 DIAGNOSIS — E782 Mixed hyperlipidemia: Secondary | ICD-10-CM | POA: Diagnosis not present

## 2017-10-04 DIAGNOSIS — Z23 Encounter for immunization: Secondary | ICD-10-CM | POA: Diagnosis not present

## 2017-10-11 DIAGNOSIS — G3184 Mild cognitive impairment, so stated: Secondary | ICD-10-CM | POA: Diagnosis not present

## 2017-10-11 DIAGNOSIS — Z6826 Body mass index (BMI) 26.0-26.9, adult: Secondary | ICD-10-CM | POA: Diagnosis not present

## 2017-10-11 DIAGNOSIS — I10 Essential (primary) hypertension: Secondary | ICD-10-CM | POA: Diagnosis not present

## 2017-10-21 DIAGNOSIS — H5213 Myopia, bilateral: Secondary | ICD-10-CM | POA: Diagnosis not present

## 2017-10-21 DIAGNOSIS — H3561 Retinal hemorrhage, right eye: Secondary | ICD-10-CM | POA: Diagnosis not present

## 2017-10-21 DIAGNOSIS — H52223 Regular astigmatism, bilateral: Secondary | ICD-10-CM | POA: Diagnosis not present

## 2017-10-21 DIAGNOSIS — H524 Presbyopia: Secondary | ICD-10-CM | POA: Diagnosis not present

## 2017-11-15 DIAGNOSIS — L4050 Arthropathic psoriasis, unspecified: Secondary | ICD-10-CM | POA: Diagnosis not present

## 2017-11-15 DIAGNOSIS — M79675 Pain in left toe(s): Secondary | ICD-10-CM | POA: Diagnosis not present

## 2017-11-15 DIAGNOSIS — M150 Primary generalized (osteo)arthritis: Secondary | ICD-10-CM | POA: Diagnosis not present

## 2017-11-15 DIAGNOSIS — M5136 Other intervertebral disc degeneration, lumbar region: Secondary | ICD-10-CM | POA: Diagnosis not present

## 2017-11-15 DIAGNOSIS — M8580 Other specified disorders of bone density and structure, unspecified site: Secondary | ICD-10-CM | POA: Diagnosis not present

## 2017-11-15 DIAGNOSIS — Z6827 Body mass index (BMI) 27.0-27.9, adult: Secondary | ICD-10-CM | POA: Diagnosis not present

## 2017-11-15 DIAGNOSIS — L401 Generalized pustular psoriasis: Secondary | ICD-10-CM | POA: Diagnosis not present

## 2017-11-15 DIAGNOSIS — M25551 Pain in right hip: Secondary | ICD-10-CM | POA: Diagnosis not present

## 2017-11-15 DIAGNOSIS — E663 Overweight: Secondary | ICD-10-CM | POA: Diagnosis not present

## 2017-12-05 DIAGNOSIS — X32XXXD Exposure to sunlight, subsequent encounter: Secondary | ICD-10-CM | POA: Diagnosis not present

## 2017-12-05 DIAGNOSIS — B078 Other viral warts: Secondary | ICD-10-CM | POA: Diagnosis not present

## 2017-12-05 DIAGNOSIS — C44529 Squamous cell carcinoma of skin of other part of trunk: Secondary | ICD-10-CM | POA: Diagnosis not present

## 2017-12-05 DIAGNOSIS — L570 Actinic keratosis: Secondary | ICD-10-CM | POA: Diagnosis not present

## 2017-12-05 DIAGNOSIS — C44519 Basal cell carcinoma of skin of other part of trunk: Secondary | ICD-10-CM | POA: Diagnosis not present

## 2017-12-09 ENCOUNTER — Emergency Department (HOSPITAL_COMMUNITY)
Admission: EM | Admit: 2017-12-09 | Discharge: 2017-12-09 | Disposition: A | Discharge status: Home / Self Care | Payer: Medicare Other | Attending: Emergency Medicine | Admitting: Emergency Medicine

## 2017-12-09 ENCOUNTER — Other Ambulatory Visit: Payer: Self-pay

## 2017-12-09 ENCOUNTER — Encounter (HOSPITAL_COMMUNITY): Payer: Self-pay

## 2017-12-09 ENCOUNTER — Emergency Department (HOSPITAL_COMMUNITY): Payer: Medicare Other

## 2017-12-09 DIAGNOSIS — R000 Tachycardia, unspecified: Secondary | ICD-10-CM | POA: Diagnosis not present

## 2017-12-09 DIAGNOSIS — R42 Dizziness and giddiness: Secondary | ICD-10-CM

## 2017-12-09 DIAGNOSIS — R4182 Altered mental status, unspecified: Secondary | ICD-10-CM | POA: Diagnosis not present

## 2017-12-09 DIAGNOSIS — F0390 Unspecified dementia without behavioral disturbance: Secondary | ICD-10-CM | POA: Insufficient documentation

## 2017-12-09 DIAGNOSIS — R55 Syncope and collapse: Secondary | ICD-10-CM | POA: Diagnosis not present

## 2017-12-09 DIAGNOSIS — I10 Essential (primary) hypertension: Secondary | ICD-10-CM | POA: Diagnosis not present

## 2017-12-09 DIAGNOSIS — R0602 Shortness of breath: Secondary | ICD-10-CM | POA: Diagnosis not present

## 2017-12-09 HISTORY — DX: Unspecified dementia, unspecified severity, without behavioral disturbance, psychotic disturbance, mood disturbance, and anxiety: F03.90

## 2017-12-09 LAB — COMPREHENSIVE METABOLIC PANEL
ALK PHOS: 65 U/L (ref 38–126)
ALT: 28 U/L (ref 0–44)
AST: 35 U/L (ref 15–41)
Albumin: 4.7 g/dL (ref 3.5–5.0)
Anion gap: 14 (ref 5–15)
BILIRUBIN TOTAL: 1.1 mg/dL (ref 0.3–1.2)
BUN: 20 mg/dL (ref 8–23)
CALCIUM: 10.8 mg/dL — AB (ref 8.9–10.3)
CO2: 19 mmol/L — AB (ref 22–32)
Chloride: 108 mmol/L (ref 98–111)
Creatinine, Ser: 1.21 mg/dL (ref 0.61–1.24)
GFR calc non Af Amer: 59 mL/min — ABNORMAL LOW (ref >60)
Glucose, Bld: 113 mg/dL — ABNORMAL HIGH (ref 70–99)
Potassium: 3.5 mmol/L (ref 3.5–5.1)
SODIUM: 141 mmol/L (ref 135–145)
TOTAL PROTEIN: 8.1 g/dL (ref 6.5–8.1)

## 2017-12-09 LAB — CBC WITH DIFFERENTIAL/PLATELET
ABS IMMATURE GRANULOCYTES: 0.03 10*3/uL (ref 0.00–0.07)
BASOS PCT: 1 %
Basophils Absolute: 0.1 10*3/uL (ref 0.0–0.1)
EOS ABS: 0.2 10*3/uL (ref 0.0–0.5)
EOS PCT: 2 %
HCT: 48 % (ref 39.0–52.0)
Hemoglobin: 15.6 g/dL (ref 13.0–17.0)
IMMATURE GRANULOCYTES: 0 %
Lymphocytes Relative: 21 %
Lymphs Abs: 1.9 10*3/uL (ref 0.7–4.0)
MCH: 28.2 pg (ref 26.0–34.0)
MCHC: 32.5 g/dL (ref 30.0–36.0)
MCV: 86.8 fL (ref 80.0–100.0)
Monocytes Absolute: 1 10*3/uL (ref 0.1–1.0)
Monocytes Relative: 11 %
NRBC: 0 % (ref 0.0–0.2)
Neutro Abs: 6 10*3/uL (ref 1.7–7.7)
Neutrophils Relative %: 65 %
Platelets: 361 10*3/uL (ref 150–400)
RBC: 5.53 MIL/uL (ref 4.22–5.81)
RDW: 14.6 % (ref 11.5–15.5)
WBC: 9.1 10*3/uL (ref 4.0–10.5)

## 2017-12-09 LAB — URINALYSIS, ROUTINE W REFLEX MICROSCOPIC
Bilirubin Urine: NEGATIVE
GLUCOSE, UA: NEGATIVE mg/dL
HGB URINE DIPSTICK: NEGATIVE
KETONES UR: 5 mg/dL — AB
LEUKOCYTES UA: NEGATIVE
Nitrite: NEGATIVE
PROTEIN: NEGATIVE mg/dL
Specific Gravity, Urine: 1.014 (ref 1.005–1.030)
pH: 9 — ABNORMAL HIGH (ref 5.0–8.0)

## 2017-12-09 LAB — RAPID URINE DRUG SCREEN, HOSP PERFORMED
AMPHETAMINES: NOT DETECTED
BENZODIAZEPINES: NOT DETECTED
Barbiturates: NOT DETECTED
Cocaine: NOT DETECTED
OPIATES: NOT DETECTED
Tetrahydrocannabinol: NOT DETECTED

## 2017-12-09 LAB — TROPONIN I: Troponin I: <0.03 ng/mL (ref <0.03)

## 2017-12-09 MED ORDER — SODIUM CHLORIDE 0.9 % IV BOLUS
500.0000 mL | Freq: Once | INTRAVENOUS | Status: AC
  Administered 2017-12-09: 500 mL via INTRAVENOUS

## 2017-12-09 MED ORDER — MECLIZINE HCL 12.5 MG PO TABS
25.0000 mg | ORAL_TABLET | Freq: Once | ORAL | Status: AC
  Administered 2017-12-09: 25 mg via ORAL
  Filled 2017-12-09: qty 2

## 2017-12-09 MED ORDER — MECLIZINE HCL 12.5 MG PO TABS: 12.5000 mg | ORAL_TABLET | Freq: Three times a day (TID) | ORAL | 0 refills | Status: DC | PRN

## 2017-12-09 MED ORDER — LORAZEPAM 2 MG/ML IJ SOLN
0.5000 mg | Freq: Once | INTRAMUSCULAR | Status: AC
  Administered 2017-12-09: 0.5 mg via INTRAVENOUS
  Filled 2017-12-09: qty 1

## 2017-12-09 NOTE — ED Notes (Signed)
Pt started on 2L of O2 per [place]

## 2017-12-09 NOTE — Discharge Instructions (Signed)
We believe your symptoms were caused by benign vertigo.  Please read through the included information and take any prescribed medication(s).  Follow up with your doctor as listed above.  If you develop any new or worsening symptoms that concern you, including but not limited to persistent dizziness/vertigo, numbness or weakness in your arms or legs, altered mental status, persistent vomiting, or fever greater than 101, please return immediately to the Emergency Department.  

## 2017-12-09 NOTE — ED Notes (Signed)
Family at bedside. 

## 2017-12-09 NOTE — ED Notes (Signed)
Pt was ambulated with no oxygen. O2 sats fluctuated in between 92 and 96.

## 2017-12-09 NOTE — ED Triage Notes (Signed)
Pt wife states pt began with dizziness before dinner time, but wife unable to say exact time. Pt arrived hyperventilating and slumped over in wheelchair. Pt wife states he had diarrhea this am and was cold all day yesterday.pt is talking but not wanting to answer questions at this time.

## 2017-12-09 NOTE — ED Provider Notes (Signed)
Emergency Department Provider Note   I have reviewed the triage vital signs and the nursing notes.   HISTORY  Chief Complaint Dizziness   HPI Douglas Reynolds is a 72 y.o. male with PMH of HLD, HTN, and dementia presents to the emergency department for evaluation of acute onset vertigo/dizziness with rapid breathing.  Patient denies any shortness of breath.  Wife states that symptoms began yesterday evening.  Patient describes having very cold hands and feet with one episodes of non-bloody diarrhea this AM.  Patient denies any heart palpitations or chest pain.  No fevers or shaking chills.  No prior history of anxiety.  Patient denies any ear ringing or vertigo symptoms in the past. Only new medication is Namenda which is being titrated up over the last several weeks. Wife states that patient has appeared very weak this AM.   Past Medical History:  Diagnosis Date  . Dementia ([place])   . Hyperlipidemia 03/27/2011  . Hypertension 03/27/2011  . Psoriatic arthritis ([place]) 03/27/2011    Patient Active Problem List   Diagnosis Date Noted  . Psoriatic arthritis ([place]) 03/27/2011  . Hyperlipidemia 03/27/2011  . Hypertension 03/27/2011    History reviewed. No pertinent surgical history.   Allergies Patient has no known allergies.  History reviewed. No pertinent family history.  Social History Social History   Tobacco Use  . Smoking status: Never Smoker  . Smokeless tobacco: Never Used  Substance Use Topics  . Alcohol use: Never    Frequency: Never  . Drug use: Never    Review of Systems  Constitutional: No fever/chills. Positive generalized weakness.  Eyes: No visual changes. ENT: No sore throat. Positive vertigo.  Cardiovascular: Denies chest pain. Respiratory: Positive shortness of breath. Gastrointestinal: No abdominal pain.  No nausea, no vomiting.  No diarrhea.  No constipation. Genitourinary: Negative for dysuria. Musculoskeletal: Negative for back  pain. Skin: Negative for rash. Neurological: Negative for headaches, focal weakness or numbness.  10-point ROS otherwise negative.  ____________________________________________   PHYSICAL EXAM:  VITAL SIGNS: ED Triage Vitals  Enc Vitals Group     BP 12/09/17 1200 (!) 144/110     Pulse Rate 12/09/17 1200 (!) 102     Resp 12/09/17 1200 (!) 38     Temp 12/09/17 1200 98.5 F (36.9 C)     Temp Source 12/09/17 1200 Rectal     SpO2 12/09/17 1200 100 %     Pain Score 12/09/17 1155 0   Constitutional: Alert and oriented. Patient breathing rapidly and deeply. Intermittently agitated but responding to questioning briskly and appropriately.  Eyes: Conjunctivae are normal. PERRL.  Head: Atraumatic. Nose: No congestion/rhinnorhea. Mouth/Throat: Mucous membranes are moist.  Oropharynx non-erythematous. Neck: No stridor.   Cardiovascular: Tachycardia. Good peripheral circulation. Grossly normal heart sounds.   Respiratory: Normal respiratory effort.  No retractions. Lungs CTAB. Gastrointestinal: Soft and nontender. No distention.  Musculoskeletal: No lower extremity tenderness nor edema. No gross deformities of extremities. Neurologic:  Normal speech and language. No gross focal neurologic deficits are appreciated. Normal CN exam 2-12. No pronator drift. Normal finger-to-nose and heel-to-shin.  Skin:  Skin is warm, dry and intact. No rash noted.  ____________________________________________   LABS (all labs ordered are listed, but only abnormal results are displayed)  Labs Reviewed  COMPREHENSIVE METABOLIC PANEL - Abnormal; Notable for the following components:      Result Value   CO2 19 (*)    Glucose, Bld 113 (*)    Calcium 10.8 (*)  GFR calc non Af Amer 59 (*)    All other components within normal limits  URINALYSIS, ROUTINE W REFLEX MICROSCOPIC - Abnormal; Notable for the following components:   APPearance CLOUDY (*)    pH 9.0 (*)    Ketones, ur 5 (*)    All other  components within normal limits  TROPONIN I  CBC WITH DIFFERENTIAL/PLATELET  RAPID URINE DRUG SCREEN, HOSP PERFORMED  TROPONIN I   ____________________________________________  EKG   EKG Interpretation  Date/Time:  Monday December 09 2017 13:16:45 EST Ventricular Rate:  91 PR Interval:    QRS Duration: 107 QT Interval:  384 QTC Calculation: 473 R Axis:   17 Text Interpretation:  Sinus rhythm Borderline T wave abnormalities No STEMI.  Confirmed by Nanda Quinton [phone]) on 12/09/2017 1:26:53 PM       ____________________________________________  RADIOLOGY  Dg Chest 2 View  Result Date: 12/09/2017 CLINICAL DATA:  Dizziness and shortness of breath EXAM: CHEST - 2 VIEW COMPARISON:  08/17/2015 FINDINGS: Cardiac shadows within normal limits. Aortic calcifications are again seen and stable. The lungs are well aerated bilaterally. No focal infiltrate or effusion is seen. Mild degenerative changes of the thoracic spine are noted. IMPRESSION: No acute abnormality noted. Electronically Signed   By: Inez Catalina M.D.   On: 12/09/2017 13:10   Ct Head Wo Contrast  Result Date: 12/09/2017 CLINICAL DATA:  72 y/o M; unexplained altered level of consciousness. EXAM: CT HEAD WITHOUT CONTRAST TECHNIQUE: Contiguous axial images were obtained from the base of the skull through the vertex without intravenous contrast. COMPARISON:  05/12/2009 CT head FINDINGS: Brain: No evidence of acute infarction, hemorrhage, hydrocephalus, extra-axial collection or mass lesion/mass effect. Progression of nonspecific white matter hypodensities chronic microvascular ischemic changes. Progression of brain volume loss Vascular: Calcific atherosclerosis of carotid siphons. Hyperdense vessel identified Skull: Normal. Negative for fracture or focal lesion. Sinuses/Orbits: No acute finding. Other: None. IMPRESSION: 1. No acute intracranial abnormality identified. 2. Progression of chronic microvascular ischemic changes and volume  loss of the brain from 2011. Electronically Signed   By: Kristine Garbe M.D.   On: 12/09/2017 13:36    ____________________________________________   PROCEDURES  Procedure(s) performed:   Procedures  None ____________________________________________   INITIAL IMPRESSION / ASSESSMENT AND PLAN / ED COURSE  Pertinent labs & imaging results that were available during my care of the patient were reviewed by me and considered in my medical decision making (see chart for details).  Patient presents to the emergency department for evaluation of shortness of breath, dizziness.  No focal neurological deficits on my exam as outlined above.  The patient does have tachycardia and appears very anxious.  No confusion or fever.  Lungs are clear with normal oxygen saturation.  No wheezing or rhonchi.  Plan for meclizine and small dose of Ativan.  Patient has been titrating up his Namenda and reached maximal dose last week.  Clear if this could be contributing at this time.  Plan for chest x-ray, CT imaging of the head, labs, and reassess.  02:00 PM Patient feeling much better after Ativan and Meclizine. Vertigo has completely resolved. Labs with no clear abnormality. CT imaging with chronic changes but nothing acute. CXR unremarkable. Plan for repeat troponin and ambulation here in the ED. Some hypoxemia with sleeping but none while awake.  04:25 PM She continues to feel well.  He ambulated in the emergency department with no gait instability or hypoxemia.  I suspect peripheral vertigo primarily.  Additional work-up undertaken given  the patient's mild dementia and nonspecific symptoms.  Repeat troponin is negative.  I provided contact information for local neurology given the patient's neurocognitive decline/dementia.  Patient will be given meclizine as needed for dizziness symptoms at home along with PCP follow-up.  Discussed with the wife ED return precautions including sudden worsening  symptoms that were unable to be controlled with meclizine, chest pain, or additional shortness of breath. ____________________________________________  FINAL CLINICAL IMPRESSION(S) / ED DIAGNOSES  Final diagnoses:  Vertigo  Near syncope     MEDICATIONS GIVEN DURING THIS VISIT:  Medications  sodium chloride 0.9 % bolus 500 mL (500 mLs Intravenous New Bag/Given 12/09/17 1244)  meclizine (ANTIVERT) tablet 25 mg (25 mg Oral Given 12/09/17 1236)  LORazepam (ATIVAN) injection 0.5 mg (0.5 mg Intravenous Given 12/09/17 1236)     NEW OUTPATIENT MEDICATIONS STARTED DURING THIS VISIT:  Current Discharge Medication List    START taking these medications   Details  meclizine (ANTIVERT) 12.5 MG tablet Take 1 tablet (12.5 mg total) by mouth 3 (three) times daily as needed for dizziness. Qty: 30 tablet, Refills: 0        Note:  This document was prepared using Dragon voice recognition software and may include unintentional dictation errors.  Nanda Quinton, MD Emergency Medicine    Long, [facility], MD 12/09/17 [id number]

## 2017-12-09 NOTE — ED Notes (Signed)
Wife reports recently changed to highest dose of namenda combination drug for dementia this past Thursday.

## 2017-12-11 DIAGNOSIS — J310 Chronic rhinitis: Secondary | ICD-10-CM | POA: Diagnosis not present

## 2017-12-11 DIAGNOSIS — F419 Anxiety disorder, unspecified: Secondary | ICD-10-CM | POA: Diagnosis not present

## 2017-12-11 DIAGNOSIS — F0390 Unspecified dementia without behavioral disturbance: Secondary | ICD-10-CM | POA: Diagnosis not present

## 2017-12-11 DIAGNOSIS — R4182 Altered mental status, unspecified: Secondary | ICD-10-CM | POA: Diagnosis not present

## 2017-12-11 DIAGNOSIS — Z9181 History of falling: Secondary | ICD-10-CM | POA: Diagnosis not present

## 2017-12-11 DIAGNOSIS — H8110 Benign paroxysmal vertigo, unspecified ear: Secondary | ICD-10-CM | POA: Diagnosis not present

## 2017-12-11 DIAGNOSIS — R410 Disorientation, unspecified: Secondary | ICD-10-CM | POA: Diagnosis not present

## 2018-01-03 DIAGNOSIS — F419 Anxiety disorder, unspecified: Secondary | ICD-10-CM | POA: Diagnosis not present

## 2018-01-03 DIAGNOSIS — R410 Disorientation, unspecified: Secondary | ICD-10-CM | POA: Diagnosis not present

## 2018-01-03 DIAGNOSIS — F0280 Dementia in other diseases classified elsewhere without behavioral disturbance: Secondary | ICD-10-CM | POA: Diagnosis not present

## 2018-01-03 DIAGNOSIS — Z9181 History of falling: Secondary | ICD-10-CM | POA: Diagnosis not present

## 2018-01-03 DIAGNOSIS — H8110 Benign paroxysmal vertigo, unspecified ear: Secondary | ICD-10-CM | POA: Diagnosis not present

## 2018-01-03 DIAGNOSIS — R4182 Altered mental status, unspecified: Secondary | ICD-10-CM | POA: Diagnosis not present

## 2018-02-06 DIAGNOSIS — Z08 Encounter for follow-up examination after completed treatment for malignant neoplasm: Secondary | ICD-10-CM | POA: Diagnosis not present

## 2018-02-06 DIAGNOSIS — Z85828 Personal history of other malignant neoplasm of skin: Secondary | ICD-10-CM | POA: Diagnosis not present

## 2018-02-06 DIAGNOSIS — B078 Other viral warts: Secondary | ICD-10-CM | POA: Diagnosis not present

## 2018-02-06 DIAGNOSIS — L820 Inflamed seborrheic keratosis: Secondary | ICD-10-CM | POA: Diagnosis not present

## 2018-02-06 DIAGNOSIS — L570 Actinic keratosis: Secondary | ICD-10-CM | POA: Diagnosis not present

## 2018-02-06 DIAGNOSIS — X32XXXD Exposure to sunlight, subsequent encounter: Secondary | ICD-10-CM | POA: Diagnosis not present

## 2018-02-12 DIAGNOSIS — L401 Generalized pustular psoriasis: Secondary | ICD-10-CM | POA: Diagnosis not present

## 2018-02-12 DIAGNOSIS — M5136 Other intervertebral disc degeneration, lumbar region: Secondary | ICD-10-CM | POA: Diagnosis not present

## 2018-02-12 DIAGNOSIS — M25551 Pain in right hip: Secondary | ICD-10-CM | POA: Diagnosis not present

## 2018-02-12 DIAGNOSIS — Z6827 Body mass index (BMI) 27.0-27.9, adult: Secondary | ICD-10-CM | POA: Diagnosis not present

## 2018-02-12 DIAGNOSIS — E663 Overweight: Secondary | ICD-10-CM | POA: Diagnosis not present

## 2018-02-12 DIAGNOSIS — L4050 Arthropathic psoriasis, unspecified: Secondary | ICD-10-CM | POA: Diagnosis not present

## 2018-02-12 DIAGNOSIS — M79675 Pain in left toe(s): Secondary | ICD-10-CM | POA: Diagnosis not present

## 2018-02-12 DIAGNOSIS — M8580 Other specified disorders of bone density and structure, unspecified site: Secondary | ICD-10-CM | POA: Diagnosis not present

## 2018-02-12 DIAGNOSIS — M150 Primary generalized (osteo)arthritis: Secondary | ICD-10-CM | POA: Diagnosis not present

## 2018-06-01 DIAGNOSIS — R509 Fever, unspecified: Secondary | ICD-10-CM | POA: Diagnosis not present

## 2018-06-01 DIAGNOSIS — R5383 Other fatigue: Secondary | ICD-10-CM | POA: Diagnosis not present

## 2018-06-01 DIAGNOSIS — W57XXXA Bitten or stung by nonvenomous insect and other nonvenomous arthropods, initial encounter: Secondary | ICD-10-CM | POA: Diagnosis not present

## 2018-06-01 DIAGNOSIS — M2550 Pain in unspecified joint: Secondary | ICD-10-CM | POA: Diagnosis not present

## 2018-06-01 DIAGNOSIS — R42 Dizziness and giddiness: Secondary | ICD-10-CM | POA: Diagnosis not present

## 2018-06-01 DIAGNOSIS — Z20828 Contact with and (suspected) exposure to other viral communicable diseases: Secondary | ICD-10-CM | POA: Diagnosis not present

## 2018-06-12 DIAGNOSIS — L4052 Psoriatic arthritis mutilans: Secondary | ICD-10-CM | POA: Diagnosis not present

## 2018-06-12 DIAGNOSIS — J111 Influenza due to unidentified influenza virus with other respiratory manifestations: Secondary | ICD-10-CM | POA: Diagnosis not present

## 2018-06-12 DIAGNOSIS — I10 Essential (primary) hypertension: Secondary | ICD-10-CM | POA: Diagnosis not present

## 2018-06-12 DIAGNOSIS — R630 Anorexia: Secondary | ICD-10-CM | POA: Diagnosis not present

## 2018-06-12 DIAGNOSIS — Z6826 Body mass index (BMI) 26.0-26.9, adult: Secondary | ICD-10-CM | POA: Diagnosis not present

## 2018-06-12 DIAGNOSIS — S81802D Unspecified open wound, left lower leg, subsequent encounter: Secondary | ICD-10-CM | POA: Diagnosis not present

## 2018-06-12 DIAGNOSIS — S81002D Unspecified open wound, left knee, subsequent encounter: Secondary | ICD-10-CM | POA: Diagnosis not present

## 2018-06-12 DIAGNOSIS — R350 Frequency of micturition: Secondary | ICD-10-CM | POA: Diagnosis not present

## 2018-06-12 DIAGNOSIS — G3184 Mild cognitive impairment, so stated: Secondary | ICD-10-CM | POA: Diagnosis not present

## 2018-06-12 DIAGNOSIS — Z6827 Body mass index (BMI) 27.0-27.9, adult: Secondary | ICD-10-CM | POA: Diagnosis not present

## 2018-06-12 DIAGNOSIS — Z4802 Encounter for removal of sutures: Secondary | ICD-10-CM | POA: Diagnosis not present

## 2018-06-12 DIAGNOSIS — R634 Abnormal weight loss: Secondary | ICD-10-CM | POA: Diagnosis not present

## 2018-06-12 DIAGNOSIS — M791 Myalgia, unspecified site: Secondary | ICD-10-CM | POA: Diagnosis not present

## 2018-06-12 DIAGNOSIS — M545 Low back pain: Secondary | ICD-10-CM | POA: Diagnosis not present

## 2018-06-12 DIAGNOSIS — R5383 Other fatigue: Secondary | ICD-10-CM | POA: Diagnosis not present

## 2018-06-12 DIAGNOSIS — E782 Mixed hyperlipidemia: Secondary | ICD-10-CM | POA: Diagnosis not present

## 2018-06-23 DIAGNOSIS — M791 Myalgia, unspecified site: Secondary | ICD-10-CM | POA: Diagnosis not present

## 2018-06-23 DIAGNOSIS — L4052 Psoriatic arthritis mutilans: Secondary | ICD-10-CM | POA: Diagnosis not present

## 2018-06-23 DIAGNOSIS — G3184 Mild cognitive impairment, so stated: Secondary | ICD-10-CM | POA: Diagnosis not present

## 2018-06-23 DIAGNOSIS — E782 Mixed hyperlipidemia: Secondary | ICD-10-CM | POA: Diagnosis not present

## 2018-06-23 DIAGNOSIS — I10 Essential (primary) hypertension: Secondary | ICD-10-CM | POA: Diagnosis not present

## 2018-06-23 DIAGNOSIS — L570 Actinic keratosis: Secondary | ICD-10-CM | POA: Diagnosis not present

## 2018-06-23 DIAGNOSIS — R438 Other disturbances of smell and taste: Secondary | ICD-10-CM | POA: Diagnosis not present

## 2018-06-23 DIAGNOSIS — R634 Abnormal weight loss: Secondary | ICD-10-CM | POA: Diagnosis not present

## 2018-06-23 DIAGNOSIS — M545 Low back pain: Secondary | ICD-10-CM | POA: Diagnosis not present

## 2018-06-23 DIAGNOSIS — R945 Abnormal results of liver function studies: Secondary | ICD-10-CM | POA: Diagnosis not present

## 2018-06-23 DIAGNOSIS — R5383 Other fatigue: Secondary | ICD-10-CM | POA: Diagnosis not present

## 2018-06-23 DIAGNOSIS — R300 Dysuria: Secondary | ICD-10-CM | POA: Diagnosis not present

## 2018-09-26 DIAGNOSIS — Z4802 Encounter for removal of sutures: Secondary | ICD-10-CM | POA: Diagnosis not present

## 2018-09-26 DIAGNOSIS — F0280 Dementia in other diseases classified elsewhere without behavioral disturbance: Secondary | ICD-10-CM | POA: Diagnosis not present

## 2018-09-26 DIAGNOSIS — R410 Disorientation, unspecified: Secondary | ICD-10-CM | POA: Diagnosis not present

## 2018-09-26 DIAGNOSIS — R5383 Other fatigue: Secondary | ICD-10-CM | POA: Diagnosis not present

## 2018-09-26 DIAGNOSIS — R4182 Altered mental status, unspecified: Secondary | ICD-10-CM | POA: Diagnosis not present

## 2018-09-26 DIAGNOSIS — R945 Abnormal results of liver function studies: Secondary | ICD-10-CM | POA: Diagnosis not present

## 2018-09-26 DIAGNOSIS — M791 Myalgia, unspecified site: Secondary | ICD-10-CM | POA: Diagnosis not present

## 2018-09-26 DIAGNOSIS — S81002D Unspecified open wound, left knee, subsequent encounter: Secondary | ICD-10-CM | POA: Diagnosis not present

## 2018-09-26 DIAGNOSIS — R630 Anorexia: Secondary | ICD-10-CM | POA: Diagnosis not present

## 2018-09-26 DIAGNOSIS — R438 Other disturbances of smell and taste: Secondary | ICD-10-CM | POA: Diagnosis not present

## 2018-09-26 DIAGNOSIS — R634 Abnormal weight loss: Secondary | ICD-10-CM | POA: Diagnosis not present

## 2018-09-26 DIAGNOSIS — R300 Dysuria: Secondary | ICD-10-CM | POA: Diagnosis not present

## 2018-09-29 DIAGNOSIS — R945 Abnormal results of liver function studies: Secondary | ICD-10-CM | POA: Diagnosis not present

## 2018-09-29 DIAGNOSIS — R438 Other disturbances of smell and taste: Secondary | ICD-10-CM | POA: Diagnosis not present

## 2018-09-29 DIAGNOSIS — G3184 Mild cognitive impairment, so stated: Secondary | ICD-10-CM | POA: Diagnosis not present

## 2018-09-29 DIAGNOSIS — M545 Low back pain: Secondary | ICD-10-CM | POA: Diagnosis not present

## 2018-09-29 DIAGNOSIS — L570 Actinic keratosis: Secondary | ICD-10-CM | POA: Diagnosis not present

## 2018-09-29 DIAGNOSIS — M791 Myalgia, unspecified site: Secondary | ICD-10-CM | POA: Diagnosis not present

## 2018-09-29 DIAGNOSIS — L4052 Psoriatic arthritis mutilans: Secondary | ICD-10-CM | POA: Diagnosis not present

## 2018-09-29 DIAGNOSIS — R300 Dysuria: Secondary | ICD-10-CM | POA: Diagnosis not present

## 2018-09-29 DIAGNOSIS — R634 Abnormal weight loss: Secondary | ICD-10-CM | POA: Diagnosis not present

## 2018-09-29 DIAGNOSIS — E782 Mixed hyperlipidemia: Secondary | ICD-10-CM | POA: Diagnosis not present

## 2018-09-29 DIAGNOSIS — I10 Essential (primary) hypertension: Secondary | ICD-10-CM | POA: Diagnosis not present

## 2018-09-29 DIAGNOSIS — R5383 Other fatigue: Secondary | ICD-10-CM | POA: Diagnosis not present

## 2018-10-09 DIAGNOSIS — Z23 Encounter for immunization: Secondary | ICD-10-CM | POA: Diagnosis not present

## 2018-10-13 ENCOUNTER — Encounter (HOSPITAL_COMMUNITY): Payer: Self-pay | Admitting: Physical Therapy

## 2018-10-13 ENCOUNTER — Other Ambulatory Visit: Payer: Self-pay

## 2018-10-13 ENCOUNTER — Ambulatory Visit (HOSPITAL_COMMUNITY): Payer: Medicare Other | Attending: Internal Medicine | Admitting: Physical Therapy

## 2018-10-13 DIAGNOSIS — R262 Difficulty in walking, not elsewhere classified: Secondary | ICD-10-CM

## 2018-10-13 DIAGNOSIS — M25552 Pain in left hip: Secondary | ICD-10-CM | POA: Diagnosis not present

## 2018-10-13 NOTE — Patient Instructions (Signed)
Press into floor prior to getting up to activate your glutes. - during the day I want you to practice 2x12 pressing into the floor and standing up

## 2018-10-13 NOTE — Therapy (Signed)
[place], [place], [address] Phone: [phone]   Fax:  [phone]  Physical Therapy Evaluation  Patient Details  Name: Douglas Reynolds MRN: [id number] Date of Birth: 06/01/1945 Referring Provider (PT): Marchia Bond   Encounter Date: 10/13/2018  PT End of Session - 10/13/18 1459    Visit Number  1    Number of Visits  12    Date for PT Re-Evaluation  11/03/18    Authorization Type  medicare and BCBS supplement    Authorization Time Period  10/13/18 to 11/24/2018    Authorization - Visit Number  1    Authorization - Number of Visits  12    PT Start Time  [id number]    PT Stop Time  1455    PT Time Calculation (min)  50 min    Activity Tolerance  Patient tolerated treatment well    Behavior During Therapy  [facility] for tasks assessed/performed       Past Medical History:  Diagnosis Date  . Dementia ([place])   . Hyperlipidemia 03/27/2011  . Hypertension 03/27/2011  . Psoriatic arthritis ([place]) 03/27/2011    History reviewed. No pertinent surgical history.  There were no vitals filed for this visit.   Subjective Assessment - 10/13/18 1506    Subjective  Patient complains of left hip pain that really started about 6 months ago and has gradually progressed over the last few months. States he has found himself becoming more limited in his daily activities secondary to pain and unsteadiness on feet. Reports he feels he staggers and some of this has to do with the pain he is feeling. 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 Now he is having pain and difficulty with all these activities. 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 Pain is primarily with weightbearing and  when he stands up from seated position or ascends stairs. Some days it's not that bad and others it is. He sleeps on the second floor and sometimes he has to get on all fours to climb the stairs, as this doesn't increase his pain. 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 No pain noted at night, no low back pain noted.    Pertinent History  HTN,  chronic left hip pain    How long can you sit comfortably?  depends - on tractor, only lasts 1-2 hours, has to take breaks with mowing the field    How long can you stand comfortably?  without moving, no difficulty    How long can you walk comfortably?  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  Yes    Pain Score  8    at its worse, no current pain   Pain Location  Hip    Pain Orientation  Left    Pain Descriptors / Indicators  Aching;Stabbing;Throbbing;Sharp    Pain Type  Chronic pain    Pain Onset  More than a month ago  Pain Frequency  Occasional    Aggravating Factors   sit to stand, sitting on tractor and ascending stairs    Pain Relieving Factors  rest         [facility] PT Assessment - 10/13/18 0001      Assessment   Medical Diagnosis  left hip bursitis and balance difficulties    Referring Provider (PT)  Marchia Bond    Prior Therapy  none      Precautions   Precautions  Fall      Restrictions   Weight Bearing Restrictions  No      Balance Screen   Has the patient fallen in the past 6 months  Yes    How many times?  1    Has the patient had a decrease in activity level because of a fear of falling?   Yes    Is the patient reluctant to leave their home because of a fear of falling?   No      Home Film/video editor residence    Living Arrangements  Spouse/significant other    Type of [place]  Two level    Alternate Level  Stairs-Number of Steps  14    Alternate Level Stairs-Rails  Right   going up   [place] - single point      Prior Function   Level of Independence  Independent      Cognition   Overall Cognitive Status  Within Functional Limits for tasks assessed      Observation/Other Assessments   Focus on Therapeutic Outcomes (FOTO)   48% limited      Posture/Postural Control   Posture/Postural Control  Postural limitations    Posture Comments  increased thoracic kyphosis, sacral sitting (decreased lumbar lordosis)      ROM / Strength   AROM / PROM / Strength  AROM;Strength      AROM   AROM Assessment Site  Knee;Hip;Lumbar    Right/Left Hip  Right;Left    Right Hip Extension  15    Right Hip Flexion  120    Right Hip External Rotation   60    Right Hip Internal Rotation   15    Left Hip Extension  15    Left Hip Flexion  120    Left Hip External Rotation   60    Left Hip Internal Rotation   15    Right/Left Knee  Right;Left   WFL     Strength   Strength Assessment Site  Hip    Right/Left Hip  Right;Left    Right Hip Extension  4/5    Right Hip ABduction  4+/5    Left Hip Extension  4-/5    Left Hip ABduction  4+/5      Palpation   Spinal mobility  hypomobility noted thorughout mid thoracic spine, pain with PA testing at T12.     Palpation comment  tenderness noted along left posterior and inferior greater trochanter and along left glut med.       Special Tests   Other special tests  bilat: neg ely's test, neg SLR      Transfers   Transfers  Sit to Stand    Sit to Stand  7: Independent;Without upper extremity assist    Comments  5x sit to stand 13.8 seconds no UE support      Ambulation/Gait   Ambulation/Gait  Yes  Ambulation/Gait Assistance  7: Independent    Ambulation Distance (Feet)  50 Feet    Assistive device  Straight cane    Gait Pattern  --   decreased hip extension bilaterally and decreased trunk ROT   Ambulation Surface  Level    Gait velocity   decreased    Stairs  Yes    Stair Management Technique  No rails;With cane    Number of Stairs  4    Height of Stairs  7                Objective measurements completed on examination: See above findings.              PT Education - 10/13/18 1508    Education Details  on current condition, passive/active standing up and glut activation    Person(s) Educated  Patient    Methods  Demonstration;Explanation;Handout    Comprehension  Verbalized understanding;Returned demonstration       PT Short Term Goals - 10/13/18 1638      PT SHORT TERM GOAL #1   Title  Patient will be able to report at least 25% improvement in overall symptoms to improve QOL.    Baseline  0%    Time  3    Period  Weeks    Status  New    Target Date  11/03/18      PT SHORT TERM GOAL #2   Title  Patient will report not needed to ascend stairs on all fours over the last week to demonstrate improved functional mobility.    Time  3    Period  Weeks    Status  New    Target Date  11/03/18      PT SHORT TERM GOAL #3   Title  Patient will be independent in HEP to improve functional outcomes    Time  3    Period  Weeks    Status  New    Target Date  11/03/18        PT Long Term Goals - 10/13/18 1639      PT LONG TERM GOAL #1   Title  Patient will be able to report at least 50% improvement in overall symptoms to improve QOL.    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    Time  6    Period  Weeks    Status  New    Target Date  11/24/18      PT LONG TERM GOAL #3   Title  Patient will score with <40% impairment on FOTO to demonstrate improved functional mobility.    Baseline  48% impairment    Time  6    Period  Weeks    Status  New     Target Date  11/24/18             Plan - 10/13/18 1501    Clinical Impression Statement  Patient complains of left hip pain that really started about 6 months ago. 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 Patient presents with pain primarily with transitional mobility with weightbearing. Pain presents when hip is transitioning from hip flexion to neutral. Focus on activating glutes during these movements helped abolish transitional pain. Educated patient on more mindful/active movements with sit to stand and ascending steps. Unable to assess balance or fully clear lumbar spine on this date secondary to time restraints. Will continue to assess these in upcoming sessions to determine if they are contributing to his current condition. Patient would benefit from skilled physical therapy at this time to improve functional independence and quality of life.    Personal Factors and Comorbidities  Age;Comorbidity 1;Transportation    Comorbidities  HTN    Examination-Activity Limitations  Bend;Sit;Squat;Stairs;Stand    Examination-Participation Restrictions  Driving;Yard Work;Cleaning    Stability/Clinical Decision Making  Stable/Uncomplicated    Clinical Decision Making  Low    Rehab Potential  Good    PT Frequency  2x / week    PT Duration  6 weeks    PT Treatment/Interventions  ADLs/Self Care Home Management;Aquatic Therapy;Biofeedback;Cryotherapy;Electrical Stimulation;Iontophoresis 4mg /ml Dexamethasone;Neuromuscular re-education;Patient/family education;Manual techniques;Dry needling;Passive range of motion;Scar mobilization;Therapeutic activities;Therapeutic exercise;Balance training;Moist Heat;Traction    PT Next Visit Plan  glut activation, assess gross lumbar mobility, core strength, assess balance, assess pelvic positioning (anterior/posterior pelvic tilt)    PT Home Exercise Plan  glut activation  prior to standing from sitting or going up stairs    Consulted and Agree with Plan of Care  Patient       Patient will benefit from skilled therapeutic intervention in order to improve the following deficits and impairments:  Abnormal gait, Decreased endurance, Pain, Decreased activity tolerance, Decreased strength, Decreased balance, Decreased mobility, Difficulty walking, Decreased range of motion  Visit Diagnosis: Pain in left hip  Difficulty in walking, not elsewhere classified     Problem List Patient Active Problem List   Diagnosis Date Noted  . Psoriatic arthritis ([place]) 03/27/2011  . Hyperlipidemia 03/27/2011  . Hypertension 03/27/2011   4:41 PM, 10/13/18 Jerene Pitch, DPT Physical Therapy with [facility]  [phone] office  [place] [address] [place], [place], [address] Phone: [phone]   Fax:  [phone]  Name: Douglas Reynolds MRN: [id number] Date of Birth: [birth year]

## 2018-10-14 ENCOUNTER — Ambulatory Visit (HOSPITAL_COMMUNITY): Payer: Medicare Other | Admitting: Physical Therapy

## 2018-10-14 DIAGNOSIS — M25552 Pain in left hip: Secondary | ICD-10-CM

## 2018-10-14 DIAGNOSIS — R262 Difficulty in walking, not elsewhere classified: Secondary | ICD-10-CM

## 2018-10-14 NOTE — Therapy (Signed)
[facility] [place], [place], [address] Phone: [phone]   Fax:  [phone]  Physical Therapy Treatment  Patient Details  Name: Douglas Reynolds MRN: [id number] Date of Birth: October 05, 1945 Referring Provider (PT): Marchia Bond   Encounter Date: 10/14/2018  PT End of Session - 10/14/18 1206    Visit Number  2    Number of Visits  12    Date for PT Re-Evaluation  11/03/18    Authorization Type  medicare and BCBS supplement    Authorization Time Period  10/13/18 to 11/24/2018    Authorization - Visit Number  2    Authorization - Number of Visits  12    PT Start Time  1133    PT Stop Time  1215    PT Time Calculation (min)  42 min    Activity Tolerance  Patient tolerated treatment well    Behavior During Therapy  [facility] for tasks assessed/performed       Past Medical History:  Diagnosis Date  . Dementia ([place])   . Hyperlipidemia 03/27/2011  . Hypertension 03/27/2011  . Psoriatic arthritis ([place]) 03/27/2011    No past surgical history on file.  There were no vitals filed for this visit.  Subjective Assessment - 10/14/18 1136    Subjective  Patient reports he woke up in a lot of pain in both his knees on the inside of his knees with same intensity. States his hip hurts less than the knees and hurts at rest but more with the transition from sit to stand.    Pertinent History  HTN,  chronic left hip pain    How long can you sit comfortably?  depends - on tractor, only lasts 1-2 hours, has to take breaks with mowing the field    How long can you stand comfortably?  without moving, no difficulty    How long can you walk comfortably?  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  Yes    Pain Score  5     Pain Location  Knee    Pain Orientation  Right;Left;Medial    Pain Type  Acute pain    Pain Onset  More than a  month ago         [facility] PT Assessment - 10/14/18 0001      Assessment   Medical Diagnosis  left hip bursitis and balance difficulties    Referring Provider (PT)  Marchia Bond    Prior Therapy  none                   [place] Adult PT Treatment/Exercise - 10/14/18 0001      Exercises   Exercises  Lumbar      Lumbar Exercises: Stretches   Double Knee to Chest Stretch  --   on exercise ball - 5 minutes   Lower Trunk Rotation  --   5 minutes   Other Lumbar Stretch Exercise  exercise ball lumbar rotation on exercise ball - PT performed with traction 6 minutes    Other Lumbar Stretch Exercise  lumbar roll in lumbar spine supine 3 spots - held for 2 minutes each spot      Lumbar Exercises: Standing   Other Standing Lumbar Exercises  lumbar extension at counter x20 wiht PT support - improved  ROM noted     Other Standing Lumbar Exercises  hip extension at counter 2x5, 2" holds               PT Short Term Goals - 10/13/18 1638      PT SHORT TERM GOAL #1   Title  Patient will be able to report at least 25% improvement in overall symptoms to improve QOL.    Baseline  0%    Time  3    Period  Weeks    Status  New    Target Date  11/03/18      PT SHORT TERM GOAL #2   Title  Patient will report not needed to ascend stairs on all fours over the last week to demonstrate improved functional mobility.    Time  3    Period  Weeks    Status  New    Target Date  11/03/18      PT SHORT TERM GOAL #3   Title  Patient will be independent in HEP to improve functional outcomes    Time  3    Period  Weeks    Status  New    Target Date  11/03/18        PT Long Term Goals - 10/13/18 1639      PT LONG TERM GOAL #1   Title  Patient will be able to report at least 50% improvement in overall symptoms to improve QOL.    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    Time  6    Period  Weeks    Status  New    Target Date  11/24/18      PT LONG TERM GOAL #3   Title  Patient will score with <40% impairment on FOTO to demonstrate improved functional mobility.    Baseline  48% impairment    Time  6    Period  Weeks    Status  New    Target Date  11/24/18            Plan - 10/14/18 1205    Clinical Impression Statement  Patient demonstrated increased pain during today's session especially with transitional movements. Decreased lumbar moility noted and hip pain decreased with lumbar exercises but pain in knees continued. Transitional movement continues to bother patient as he does not bend a the hips and instead tries to stand straight up from seated position resulting increased pain and pressure on his knees. Educated patietn on this movement pattern but patient not able to correct during this session. Hip extension and glut activation both painful in low back. Mobility and unloaded glute strengthening may be beneficial next session.    Personal Factors and Comorbidities  Age;Comorbidity 1;Transportation    Comorbidities  HTN    Examination-Activity Limitations  Bend;Sit;Squat;Stairs;Stand    Examination-Participation Restrictions  Driving;Yard Work;Cleaning    Stability/Clinical Decision Making  Stable/Uncomplicated    Rehab Potential  Good    PT Frequency  2x / week    PT Duration  6 weeks    PT Treatment/Interventions  ADLs/Self Care Home Management;Aquatic  Therapy;Biofeedback;Cryotherapy;Electrical Stimulation;Iontophoresis 4mg /ml Dexamethasone;Neuromuscular re-education;Patient/family education;Manual techniques;Dry needling;Passive range of motion;Scar mobilization;Therapeutic activities;Therapeutic exercise;Balance training;Moist Heat;Traction    PT Next Visit Plan  unloaded glut activation,  assess gross lumbar mobility, core strength, assess balance, assess pelvic positioning (anterior/posterior pelvic tilt)    PT Home Exercise Plan  glut activation prior to standing from sitting or going up stairs    Consulted and Agree with Plan of Care  Patient       Patient will benefit from skilled therapeutic intervention in order to improve the following deficits and impairments:  Abnormal gait, Decreased endurance, Pain, Decreased activity tolerance, Decreased strength, Decreased balance, Decreased mobility, Difficulty walking, Decreased range of motion  Visit Diagnosis: Pain in left hip  Difficulty in walking, not elsewhere classified     Problem List Patient Active Problem List   Diagnosis Date Noted  . Psoriatic arthritis ([place]) 03/27/2011  . Hyperlipidemia 03/27/2011  . Hypertension 03/27/2011   1:08 PM, 10/14/18 Jerene Pitch, DPT Physical Therapy with [facility]  [phone] office    [place] [address] [place], [place], [address] Phone: [phone]   Fax:  [phone]  Name: Douglas Reynolds MRN: [id number] Date of Birth: 08/17/1945

## 2018-10-20 ENCOUNTER — Other Ambulatory Visit: Payer: Self-pay

## 2018-10-20 ENCOUNTER — Ambulatory Visit (HOSPITAL_COMMUNITY): Payer: Medicare Other | Admitting: Physical Therapy

## 2018-10-20 ENCOUNTER — Encounter (HOSPITAL_COMMUNITY): Payer: Self-pay | Admitting: Physical Therapy

## 2018-10-20 DIAGNOSIS — M25552 Pain in left hip: Secondary | ICD-10-CM | POA: Diagnosis not present

## 2018-10-20 DIAGNOSIS — R262 Difficulty in walking, not elsewhere classified: Secondary | ICD-10-CM

## 2018-10-20 NOTE — Therapy (Signed)
[place], [place], [address] Phone: [phone]   Fax:  [phone]  Physical Therapy Treatment  Patient Details  Name: Douglas Reynolds MRN: [id number] Date of Birth: 1945-08-20 Referring Provider (PT): Marchia Bond   Encounter Date: 10/20/2018  PT End of Session - 10/20/18 1137    Visit Number  3    Number of Visits  12    Date for PT Re-Evaluation  11/03/18    Authorization Type  medicare and BCBS supplement    Authorization Time Period  10/13/18 to 11/24/2018    Authorization - Visit Number  3    Authorization - Number of Visits  12    PT Start Time  [id number]    PT Stop Time  1212    PT Time Calculation (min)  41 min    Activity Tolerance  Patient tolerated treatment well    Behavior During Therapy  [facility] for tasks assessed/performed       Past Medical History:  Diagnosis Date  . Dementia ([place])   . Hyperlipidemia 03/27/2011  . Hypertension 03/27/2011  . Psoriatic arthritis ([place]) 03/27/2011    History reviewed. No pertinent surgical history.  There were no vitals filed for this visit.  Subjective Assessment - 10/20/18 1136    Subjective  Patient reports he woke up in a lot of pain in both his knees on the inside of his knees with same intensity. States his hip hurts less than the knees and hurts at rest but more with the transition from sit to stand.    Pertinent History  HTN,  chronic left hip pain    How long can you sit comfortably?  depends - on tractor, only lasts 1-2 hours, has to take breaks with mowing the field    How long can you stand comfortably?  without moving, no difficulty    How long can you walk comfortably?  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    Pain Onset  More than a month ago         [facility] PT Assessment - 10/20/18 0001      Assessment   Medical Diagnosis  left hip bursitis and  balance difficulties    Referring Provider (PT)  Marchia Bond    Prior Therapy  none      Palpation   Spinal mobility  hypomobility noted in lumbar spine with spring testing                   [facility] Adult PT Treatment/Exercise - 10/20/18 0001      Lumbar Exercises: Standing   Other Standing Lumbar Exercises  hip abd and extension at counter 4x6 B 3" holds      Lumbar Exercises: Seated   Other Seated Lumbar Exercises  hip hinge 4x6, 5" holds      Lumbar Exercises: Supine   Bridge  10 reps   increased pain in hip - stopped     Lumbar Exercises: Prone   Straight Leg Raise  5 reps;5 seconds      Manual Therapy   Manual Therapy  Joint mobilization    Manual therapy comments  manual interventions performed independently from other interventions    Joint Mobilization  PA to left hip in prone and with figure 4 position - grade II/III - decreased symptoms noted  afterwards               PT Short Term Goals - 10/13/18 1638      PT SHORT TERM GOAL #1   Title  Patient will be able to report at least 25% improvement in overall symptoms to improve QOL.    Baseline  0%    Time  3    Period  Weeks    Status  New    Target Date  11/03/18      PT SHORT TERM GOAL #2   Title  Patient will report not needed to ascend stairs on all fours over the last week to demonstrate improved functional mobility.    Time  3    Period  Weeks    Status  New    Target Date  11/03/18      PT SHORT TERM GOAL #3   Title  Patient will be independent in HEP to improve functional outcomes    Time  3    Period  Weeks    Status  New    Target Date  11/03/18        PT Long Term Goals - 10/13/18 1639      PT LONG TERM GOAL #1   Title  Patient will be able to report at least 50% improvement in overall symptoms to improve QOL.    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    Time  6    Period  Weeks    Status  New    Target Date  11/24/18      PT LONG TERM GOAL #3   Title  Patient will score with <40% impairment on FOTO to demonstrate improved functional mobility.    Baseline  48% impairment    Time  6    Period  Weeks    Status  New    Target Date  11/24/18            Plan - 10/20/18 1246    Clinical Impression Statement  Patient able to perform gravity eliminated hip mobility exercises without increase in hip pain, gravity resisted still too challenging for patient and increases pain. Educated patient on reason behind exercises and how this will help with his symptoms. Will continue to focus on hip strengthening and hip mobility as tolerated. Patient likely to be in increased symptoms next session secondary to needing to put some fencing up this week. Will follow up with symptoms next session.    Personal Factors and Comorbidities  Age;Comorbidity 1;Transportation    Comorbidities  HTN    Examination-Activity Limitations  Bend;Sit;Squat;Stairs;Stand    Examination-Participation Restrictions  Driving;Yard Work;Cleaning    Stability/Clinical Decision Making  Stable/Uncomplicated    Rehab Potential  Good    PT Frequency  2x / week    PT Duration  6 weeks    PT Treatment/Interventions  ADLs/Self Care Home Management;Aquatic Therapy;Biofeedback;Cryotherapy;Electrical Stimulation;Iontophoresis 4mg /ml Dexamethasone;Neuromuscular re-education;Patient/family education;Manual techniques;Dry needling;Passive range of motion;Scar mobilization;Therapeutic activities;Therapeutic exercise;Balance training;Moist Heat;Traction    PT Next Visit Plan  unloaded glut activation, assess gross lumbar mobility, core strength, assess balance,  assess pelvic positioning (anterior/posterior pelvic tilt)    PT Home Exercise Plan  glut  activation prior to standing from sitting or going up stairs    Consulted and Agree with Plan of Care  Patient       Patient will benefit from skilled therapeutic intervention in order to improve the following deficits and impairments:  Abnormal gait, Decreased endurance, Pain, Decreased activity tolerance, Decreased strength, Decreased balance, Decreased mobility, Difficulty walking, Decreased range of motion  Visit Diagnosis: Pain in left hip  Difficulty in walking, not elsewhere classified     Problem List Patient Active Problem List   Diagnosis Date Noted  . Psoriatic arthritis ([place]) 03/27/2011  . Hyperlipidemia 03/27/2011  . Hypertension 03/27/2011    12:50 PM, 10/20/18 Jerene Pitch, DPT Physical Therapy with [facility]  [phone] office   [place] [address] [place], [place], [address] Phone: [phone]   Fax:  [phone]  Name: ELIX CATALA MRN: [id number] Date of Birth: 06-20-1945

## 2018-10-24 ENCOUNTER — Other Ambulatory Visit: Payer: Self-pay

## 2018-10-24 ENCOUNTER — Ambulatory Visit (HOSPITAL_COMMUNITY): Payer: Medicare Other | Admitting: Physical Therapy

## 2018-10-24 ENCOUNTER — Encounter (HOSPITAL_COMMUNITY): Payer: Self-pay | Admitting: Physical Therapy

## 2018-10-24 DIAGNOSIS — M25552 Pain in left hip: Secondary | ICD-10-CM | POA: Diagnosis not present

## 2018-10-24 DIAGNOSIS — R262 Difficulty in walking, not elsewhere classified: Secondary | ICD-10-CM

## 2018-10-24 NOTE — Therapy (Signed)
[place], [place], [address] Phone: [phone]   Fax:  [phone]  Physical Therapy Treatment  Patient Details  Name: Douglas Reynolds MRN: [id number] Date of Birth: [birth year] Referring Provider (PT): Marchia Bond   Encounter Date: 10/24/2018  PT End of Session - 10/24/18 1031    Visit Number  4    Number of Visits  12    Date for PT Re-Evaluation  11/03/18    Authorization Type  medicare and BCBS supplement    Authorization Time Period  10/13/18 to 11/24/2018    Authorization - Visit Number  4    Authorization - Number of Visits  12    PT Start Time  0915    PT Stop Time  1000    PT Time Calculation (min)  45 min    Activity Tolerance  Patient tolerated treatment well    Behavior During Therapy  [facility] for tasks assessed/performed       Past Medical History:  Diagnosis Date  . Dementia ([place])   . Hyperlipidemia 03/27/2011  . Hypertension 03/27/2011  . Psoriatic arthritis ([place]) 03/27/2011    History reviewed. No pertinent surgical history.  There were no vitals filed for this visit.  Subjective Assessment - 10/24/18 1030    Subjective  States that he continues to have a lot of pain, pain is worse in his knees today. Reports that he hurts with transitional mobility and continued pain with walking after initial transition.    Pertinent History  HTN,  chronic left hip pain    How long can you sit comfortably?  depends - on tractor, only lasts 1-2 hours, has to take breaks with mowing the field    How long can you stand comfortably?  without moving, no difficulty    How long can you walk comfortably?  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    Pain Score  6     Pain Location  Knee    Pain Orientation  Right;Left;Medial    Pain Descriptors / Indicators  Aching;Tender;Throbbing    Pain Onset  More than a month  ago         [facility] PT Assessment - 10/24/18 0001      Assessment   Medical Diagnosis  left hip bursitis and balance difficulties    Referring Provider (PT)  Marchia Bond    Prior Therapy  none                   [facility] Adult PT Treatment/Exercise - 10/24/18 0001      Transfers   Sit to Stand  --   needs verbal and tactile cues to perform correctly   Comments  tries to just sit down and stand up with only bending his knees - practiced with anterior trunk lean and posterior hip translation - required verbal and tactile cues to perform motion- educated on how and why to perform this way - painfree with this movement and performed 30 times in clinic      Lumbar Exercises: Prone   Straight Leg Raise  5 reps;2 seconds   3 SETS BILATERALLY   Other Prone Lumbar Exercises  hamstring curls with PT resistance with concentric, isometric and eccentric 2x5, B      Manual Therapy   Manual Therapy  Joint mobilization  Manual therapy comments  manual interventions performed independently from other interventions    Joint Mobilization  CPAs to T12- L5 grade II, UPAs to T12-L5 R - reproduced symptoms in R LE grade II, PA to right hip in figure 4 position grade II               PT Education - 10/24/18 1013    Education Details  on how to transition from sit to stand, prior demo, tactile cues    Person(s) Educated  Patient    Methods  Explanation;Demonstration;Handout    Comprehension  Verbalized understanding;Returned demonstration       PT Short Term Goals - 10/13/18 1638      PT SHORT TERM GOAL #1   Title  Patient will be able to report at least 25% improvement in overall symptoms to improve QOL.    Baseline  0%    Time  3    Period  Weeks    Status  New    Target Date  11/03/18      PT SHORT TERM GOAL #2   Title  Patient will report not needed to ascend stairs on all fours over the last week to demonstrate improved functional mobility.    Time  3    Period  Weeks     Status  New    Target Date  11/03/18      PT SHORT TERM GOAL #3   Title  Patient will be independent in HEP to improve functional outcomes    Time  3    Period  Weeks    Status  New    Target Date  11/03/18        PT Long Term Goals - 10/13/18 1639      PT LONG TERM GOAL #1   Title  Patient will be able to report at least 50% improvement in overall symptoms to improve QOL.    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    Time  6    Period  Weeks    Status  New    Target Date  11/24/18      PT LONG TERM GOAL #3   Title  Patient will score with <40% impairment on FOTO to demonstrate improved functional mobility.    Baseline  48% impairment    Time  6    Period  Weeks    Status  New    Target Date  11/24/18            Plan - 10/24/18 1024    Clinical Impression Statement  Patient continued to have pain with transitional movements. Focused on educating patient on how to transition from sit to stand and stand to sit. Required constant verbal and tactile cues. Improvement noted with repetition but continued to need occasional cues during session. Instructed patient to perform this exercise many time during the day, every day to improve overall automatic movement pattern to decrease continued stress on knees, hip and back. Will follow up with form next session. Will continue posterior chain strengthening and activation in subsequent sessions.  Personal Factors and Comorbidities  Age;Comorbidity 1;Transportation    Comorbidities  HTN    Examination-Activity Limitations  Bend;Sit;Squat;Stairs;Stand    Examination-Participation Restrictions  Driving;Yard Work;Cleaning    Stability/Clinical Decision Making  Stable/Uncomplicated     Rehab Potential  Good    PT Frequency  2x / week    PT Duration  6 weeks    PT Treatment/Interventions  ADLs/Self Care Home Management;Aquatic Therapy;Biofeedback;Cryotherapy;Electrical Stimulation;Iontophoresis 4mg /ml Dexamethasone;Neuromuscular re-education;Patient/family education;Manual techniques;Dry needling;Passive range of motion;Scar mobilization;Therapeutic activities;Therapeutic exercise;Balance training;Moist Heat;Traction    PT Next Visit Plan  review sit to stand transiiton (form), unloaded glut activation, assess gross lumbar mobility, core strength, assess balance, assess pelvic positioning (anterior/posterior pelvic tilt)    PT Home Exercise Plan  glut activation prior to standing from sitting or going up stairs- form focused    Consulted and Agree with Plan of Care  Patient       Patient will benefit from skilled therapeutic intervention in order to improve the following deficits and impairments:  Abnormal gait, Decreased endurance, Pain, Decreased activity tolerance, Decreased strength, Decreased balance, Decreased mobility, Difficulty walking, Decreased range of motion  Visit Diagnosis: Pain in left hip  Difficulty in walking, not elsewhere classified     Problem List Patient Active Problem List   Diagnosis Date Noted  . Psoriatic arthritis ([place]) 03/27/2011  . Hyperlipidemia 03/27/2011  . Hypertension 03/27/2011   10:32 AM, 10/24/18 Jerene Pitch, DPT Physical Therapy with [facility]  [phone] office  [place] [address] [place], [place], [address] Phone: [phone]   Fax:  [phone]  Name: Douglas Reynolds MRN: [id number] Date of Birth: [birth year]

## 2018-10-24 NOTE — Patient Instructions (Signed)
Sit to Stand with Hands on Knees REPS: 25 DAILY: 3 WEEKLY: 7 Exercise image step1 Exercise image step2 LOOK AT THE VIDEO - PRACTICE LEANING FORWARD TO STAND UP AND LEANING FORWARD AND HAVING BUTT TRANSLATE BAKWARDS TO SIT DOWN --- PRACTICE! THIS SHOULD NOT HURT AND IF IT DOES YOU'RE NOT PERFORMING WITH CORRECT FORM SO WATCH VIDEO  Setup Begin by sitting upright on a chair with your feet slightly wider than shoulder width apart and your hands resting on your knees. Movement Lean forward at your hips until your bottom starts to lift off the chair. Move your body into a standing upright position, then reverse the order of your movements to return to the starting position. Tip Make sure not to let your knees collapse inward during the exercise.

## 2018-10-28 ENCOUNTER — Other Ambulatory Visit: Payer: Self-pay

## 2018-10-28 ENCOUNTER — Ambulatory Visit (HOSPITAL_COMMUNITY): Payer: Medicare Other | Admitting: Physical Therapy

## 2018-10-28 DIAGNOSIS — M25552 Pain in left hip: Secondary | ICD-10-CM | POA: Diagnosis not present

## 2018-10-28 DIAGNOSIS — R262 Difficulty in walking, not elsewhere classified: Secondary | ICD-10-CM | POA: Diagnosis not present

## 2018-10-28 NOTE — Therapy (Signed)
[place], [place], [address] Phone: [phone]   Fax:  [phone]  Physical Therapy Treatment  Patient Details  Name: Douglas Reynolds MRN: [id number] Date of Birth: 27-Apr-1945 Referring Provider (PT): Marchia Bond   Encounter Date: 10/28/2018  PT End of Session - 10/28/18 1219    Visit Number  5    Number of Visits  12    Date for PT Re-Evaluation  11/03/18    Authorization Type  medicare and BCBS supplement    Authorization Time Period  10/13/18 to 11/24/2018    Authorization - Visit Number  5    Authorization - Number of Visits  12    PT Start Time  1133    PT Stop Time  1215    PT Time Calculation (min)  42 min    Activity Tolerance  Patient tolerated treatment well    Behavior During Therapy  [facility] for tasks assessed/performed       Past Medical History:  Diagnosis Date  . Dementia ([place])   . Hyperlipidemia 03/27/2011  . Hypertension 03/27/2011  . Psoriatic arthritis ([place]) 03/27/2011    No past surgical history on file.  There were no vitals filed for this visit.  Subjective Assessment - 10/28/18 1135    Subjective  pt states his pain remains "medium" in his medial knees and Lt hip until he does something and then it goes up.    Currently in Pain?  Yes    Pain Score  5     Pain Location  Knee    Pain Orientation  Right;Left;Medial    Pain Descriptors / Indicators  Aching;Throbbing    Pain Type  Acute pain                       [facility] Adult PT Treatment/Exercise - 10/28/18 0001      Lumbar Exercises: Standing   Functional Squats  15 reps    Other Standing Lumbar Exercises  hip abd and extension 15 reps Bilaterally    Other Standing Lumbar Exercises  vector stance 5X5" each with 1 UE, hip hiking 4" with 1 UE 2X10 reps each      Lumbar Exercises: Seated   Sit to Stand  10 reps    Sit to Stand Limitations  2 sets with "nose over toes"      Lumbar Exercises: Prone    Straight Leg Raise  10 reps;Limitations    Straight Leg Raises Limitations  2 sets    Other Prone Lumbar Exercises  hamstring curls with 4# 2X10      Manual Therapy   Manual Therapy  Soft tissue mobilization;Myofascial release    Manual therapy comments  Lt lumbar/gluteal in Rt sidelying               PT Short Term Goals - 10/13/18 1638      PT SHORT TERM GOAL #1   Title  Patient will be able to report at least 25% improvement in overall symptoms to improve QOL.    Baseline  0%    Time  3    Period  Weeks    Status  New    Target Date  11/03/18      PT SHORT TERM GOAL #2   Title  Patient will report not needed to ascend stairs on all fours over the last week to demonstrate improved functional mobility.    Time  3  Period  Weeks    Status  New    Target Date  11/03/18      PT SHORT TERM GOAL #3   Title  Patient will be independent in HEP to improve functional outcomes    Time  3    Period  Weeks    Status  New    Target Date  11/03/18        PT Long Term Goals - 10/13/18 1639      PT LONG TERM GOAL #1   Title  Patient will be able to report at least 50% improvement in overall symptoms to improve QOL.    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    Time  6    Period  Weeks    Status  New    Target Date  11/24/18      PT LONG TERM GOAL #3   Title  Patient will score with <40% impairment on FOTO to demonstrate improved functional mobility.    Baseline  48% impairment    Time  6    Period  Weeks    Status  New    Target Date  11/24/18            Plan - 10/28/18 1221    Clinical Impression Statement  continued with movement  mechanics education and LE strengthening.  Added therex to isolate gluteal  mm bilatearlly with overall good form and minimal cues.  Pt with improved sit to stand with only minimal knee pain when completing.  Manual completed to Lt glute in Rt sidelying with general tightness but no spasm palpated.    Personal Factors and Comorbidities  Age;Comorbidity 1;Transportation    Comorbidities  HTN    Examination-Activity Limitations  Bend;Sit;Squat;Stairs;Stand    Examination-Participation Restrictions  Driving;Yard Work;Cleaning    Stability/Clinical Decision Making  Stable/Uncomplicated    Rehab Potential  Good    PT Frequency  2x / week    PT Duration  6 weeks    PT Treatment/Interventions  ADLs/Self Care Home Management;Aquatic Therapy;Biofeedback;Cryotherapy;Electrical Stimulation;Iontophoresis 4mg /ml Dexamethasone;Neuromuscular re-education;Patient/family education;Manual techniques;Dry needling;Passive range of motion;Scar mobilization;Therapeutic activities;Therapeutic exercise;Balance training;Moist Heat;Traction    PT Next Visit Plan  review sit to stand transiiton (form), unloaded glut activation, assess gross lumbar mobility, core strength, assess balance, assess pelvic positioning (anterior/posterior pelvic tilt)    PT Home Exercise Plan  glut activation prior to standing from sitting or going up stairs- form focused    Consulted and Agree with Plan of Care  Patient       Patient will benefit from skilled therapeutic intervention in order to improve the following deficits and impairments:  Abnormal gait, Decreased endurance, Pain, Decreased activity tolerance, Decreased strength, Decreased balance, Decreased mobility, Difficulty walking, Decreased range of motion  Visit Diagnosis: Difficulty in walking, not elsewhere classified  Pain in left hip     Problem List Patient Active Problem List   Diagnosis Date Noted  . Psoriatic arthritis ([place]) 03/27/2011  . Hyperlipidemia 03/27/2011  . Hypertension 03/27/2011   Teena Irani,  PTA/CLT [phone]  Roseanne Reno B 10/28/2018, 12:24 PM  [facility]  [place], [place], [address] Phone: [phone]   Fax:  [phone]  Name: Douglas Reynolds MRN: [id number] Date of Birth: Jul 11, 1945

## 2018-10-31 ENCOUNTER — Ambulatory Visit (HOSPITAL_COMMUNITY): Payer: Medicare Other | Admitting: Physical Therapy

## 2018-10-31 ENCOUNTER — Encounter (HOSPITAL_COMMUNITY): Payer: Self-pay | Admitting: Physical Therapy

## 2018-10-31 ENCOUNTER — Other Ambulatory Visit: Payer: Self-pay

## 2018-10-31 DIAGNOSIS — R262 Difficulty in walking, not elsewhere classified: Secondary | ICD-10-CM

## 2018-10-31 DIAGNOSIS — M25552 Pain in left hip: Secondary | ICD-10-CM | POA: Diagnosis not present

## 2018-10-31 NOTE — Therapy (Signed)
[place], [place], [address] Phone: [phone]   Fax:  [phone]  Physical Therapy Treatment  Patient Details  Name: Douglas Reynolds MRN: [id number] Date of Birth: September 23, 1945 Referring Provider (PT): Marchia Bond   Encounter Date: 10/31/2018  PT End of Session - 10/31/18 0825    Visit Number  6    Number of Visits  12    Date for PT Re-Evaluation  11/03/18    Authorization Type  medicare and BCBS supplement    Authorization Time Period  10/13/18 to 11/24/2018    Authorization - Visit Number  6    Authorization - Number of Visits  12    PT Start Time  0830    PT Stop Time  0915    PT Time Calculation (min)  45 min    Activity Tolerance  Patient tolerated treatment well    Behavior During Therapy  [facility] for tasks assessed/performed       Past Medical History:  Diagnosis Date  . Dementia ([place])   . Hyperlipidemia 03/27/2011  . Hypertension 03/27/2011  . Psoriatic arthritis ([place]) 03/27/2011    History reviewed. No pertinent surgical history.  There were no vitals filed for this visit.  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 States he was back to feeling good on tuesday. Haven't felt knee pain in 3-4 days    Currently in Pain?  Yes    Pain Score  5     Pain Location  Back    Pain Orientation  Mid    Pain Descriptors / Indicators  Aching         [facility] PT Assessment - 10/31/18 0001      Assessment   Medical Diagnosis  left hip bursitis and balance difficulties    Referring Provider (PT)  Marchia Bond    Prior Therapy  none                   [place] Adult PT Treatment/Exercise - 10/31/18 0001      Lumbar Exercises: Supine   Other Supine Lumbar Exercises  laying over towel  roll in supine - 3 different spots - 2 minutes each      Lumbar Exercises: Prone   Other Prone Lumbar Exercises  prone press ups 3x6, 10" holds - arms straight       Lumbar Exercises: Quadruped   [place]/Old Horse  15 reps    Madcat/Old Horse Limitations  5" holds - focus on "horse"      Manual Therapy   Manual Therapy  Joint mobilization;Soft tissue mobilization    Manual therapy comments  manual interventions performed independently from other interventions    Joint Mobilization  CPAs to T12- L5 grade II, UPAs to T12-L5 B    Soft tissue mobilization  STM to lumbar paraspinals and WL bilat - tolerated well               PT Short Term Goals - 10/13/18 1638      PT SHORT TERM GOAL #1   Title  Patient will be able to report at least 25% improvement in overall symptoms to improve QOL.    Baseline  0%  Time  3    Period  Weeks    Status  New    Target Date  11/03/18      PT SHORT TERM GOAL #2   Title  Patient will report not needed to ascend stairs on all fours over the last week to demonstrate improved functional mobility.    Time  3    Period  Weeks    Status  New    Target Date  11/03/18      PT SHORT TERM GOAL #3   Title  Patient will be independent in HEP to improve functional outcomes    Time  3    Period  Weeks    Status  New    Target Date  11/03/18        PT Long Term Goals - 10/13/18 1639      PT LONG TERM GOAL #1   Title  Patient will be able to report at least 50% improvement in overall symptoms to improve QOL.    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    Time  6    Period  Weeks    Status  New    Target Date  11/24/18      PT LONG TERM GOAL #3   Title  Patient will score with <40% impairment  on FOTO to demonstrate improved functional mobility.    Baseline  48% impairment    Time  6    Period  Weeks    Status  New    Target Date  11/24/18            Plan - 10/31/18 0825    Clinical Impression Statement  States he feels worked but much better than when he came in. States he felt new exercises really helped and he is excited to implement them. Patient tolerated new exercise and manual work well today. Will continue to work on lumbar and hip mobilization as this seems to be causing bilateral knee pain. Improved mobility noted in lumbar spine with PAs today. Patient would continue to benefit from skilled physical therapy.    Personal Factors and Comorbidities  Age;Comorbidity 1;Transportation    Comorbidities  HTN    Examination-Activity Limitations  Bend;Sit;Squat;Stairs;Stand    Examination-Participation Restrictions  Driving;Yard Work;Cleaning    Stability/Clinical Decision Making  Stable/Uncomplicated    Rehab Potential  Good    PT Frequency  2x / week    PT Duration  6 weeks    PT Treatment/Interventions  ADLs/Self Care Home Management;Aquatic Therapy;Biofeedback;Cryotherapy;Electrical Stimulation;Iontophoresis 4mg /ml Dexamethasone;Neuromuscular re-education;Patient/family education;Manual techniques;Dry needling;Passive range of motion;Scar mobilization;Therapeutic activities;Therapeutic exercise;Balance training;Moist Heat;Traction    PT Next Visit Plan  review sit to stand transiiton (form), unloaded glut activation, assess gross lumbar mobility, core strength, assess balance, assess pelvic positioning (anterior/posterior pelvic tilt)    PT Home Exercise Plan  glut activation prior to standing from sitting or going up stairs- form focused; 10/30 cat dog, prone press ups, laying on towel roll in supine (in lumbar spine)    Consulted and Agree with Plan of Care  Patient  Patient will benefit from skilled therapeutic intervention in order to improve the following  deficits and impairments:  Abnormal gait, Decreased endurance, Pain, Decreased activity tolerance, Decreased strength, Decreased balance, Decreased mobility, Difficulty walking, Decreased range of motion  Visit Diagnosis: Difficulty in walking, not elsewhere classified  Pain in left hip     Problem List Patient Active Problem List   Diagnosis Date Noted  . Psoriatic arthritis ([place]) 03/27/2011  . Hyperlipidemia 03/27/2011  . Hypertension 03/27/2011    9:34 AM, 10/31/18 Jerene Pitch, DPT Physical Therapy with [facility]  [phone] office  [place] [address] [place], [place], [address] Phone: [phone]   Fax:  [phone]  Name: Douglas Reynolds MRN: [id number] Date of Birth: Jul 31, 1945

## 2018-11-03 ENCOUNTER — Encounter (HOSPITAL_COMMUNITY): Payer: Self-pay | Admitting: Physical Therapy

## 2018-11-03 ENCOUNTER — Ambulatory Visit (HOSPITAL_COMMUNITY): Payer: Medicare Other | Attending: Internal Medicine | Admitting: Physical Therapy

## 2018-11-03 ENCOUNTER — Other Ambulatory Visit: Payer: Self-pay

## 2018-11-03 DIAGNOSIS — R262 Difficulty in walking, not elsewhere classified: Secondary | ICD-10-CM | POA: Diagnosis not present

## 2018-11-03 DIAGNOSIS — M25552 Pain in left hip: Secondary | ICD-10-CM

## 2018-11-03 NOTE — Therapy (Signed)
[place], [place], [address] Phone: [phone]   Fax:  [phone]  Physical Therapy Treatment  Patient Details  Name: Douglas Reynolds MRN: [id number] Date of Birth: 1945-08-27 Referring Provider (PT): Marchia Bond   Encounter Date: 11/03/2018  PT End of Session - 11/03/18 1531    Visit Number  7    Number of Visits  12    Date for PT Re-Evaluation  11/03/18    Authorization Type  medicare and BCBS supplement    Authorization Time Period  10/13/18 to 11/24/2018    Authorization - Visit Number  7    Authorization - Number of Visits  12    PT Start Time  [id number]    PT Stop Time  1610    PT Time Calculation (min)  40 min    Activity Tolerance  Patient tolerated treatment well    Behavior During Therapy  [facility] for tasks assessed/performed       Past Medical History:  Diagnosis Date  . Dementia ([place])   . Hyperlipidemia 03/27/2011  . Hypertension 03/27/2011  . Psoriatic arthritis ([place]) 03/27/2011    History reviewed. No pertinent surgical history.  There were no vitals filed for this visit.  Subjective Assessment - 11/03/18 1558    Subjective  States after he recovered from last sessio nhe felt great. 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 States he was in pain the next day but it wasn't as he thought it would be. States that he hasn't had so much knee pain but has been having the hip pain. States he was able to replicate a similar surface at home to do his home exercises.    Currently in Pain?  Yes    Pain Score  2     Pain Location  Hip    Pain Orientation  Left    Pain Descriptors / Indicators  Aching         [facility] PT Assessment - 11/03/18 0001      Assessment   Medical Diagnosis  left hip bursitis and balance difficulties    Referring Provider (PT)  Marchia Bond    Prior Therapy  none                   [place] Adult PT Treatment/Exercise - 11/03/18 0001      Lumbar Exercises: Standing   Other Standing Lumbar Exercises  self mobilization with lacrosse ball - 6 minutes to glutes and lumbar paraspinals      Lumbar Exercises: Prone   Straight Leg Raise  15 reps;3 seconds   bilateral - 2 sets   Other Prone Lumbar Exercises  knee flexion L with holds at end range x10, 10" holds     Other Prone Lumbar Exercises  prone press ups 1x15, 10" holds - arms straight       Manual Therapy   Manual Therapy  Joint mobilization;Soft tissue mobilization    Manual therapy comments  manual interventions performed independently from other interventions    Joint Mobilization  PA to left hip in prone and in figure four position, grade II/III, UPA to left sacral border grade II/III     Soft tissue mobilization  STM to left piriformis, glut med and lumbar paraspinals L                PT Short Term Goals - 10/13/18 1638      PT SHORT TERM  GOAL #1   Title  Patient will be able to report at least 25% improvement in overall symptoms to improve QOL.    Baseline  0%    Time  3    Period  Weeks    Status  New    Target Date  11/03/18      PT SHORT TERM GOAL #2   Title  Patient will report not needed to ascend stairs on all fours over the last week to demonstrate improved functional mobility.    Time  3    Period  Weeks    Status  New    Target Date  11/03/18      PT SHORT TERM GOAL #3   Title  Patient will be independent in HEP to improve functional outcomes    Time  3    Period  Weeks    Status  New    Target Date  11/03/18        PT Long Term Goals - 10/13/18 1639      PT LONG TERM GOAL #1   Title  Patient will be able to report at least 50% improvement in overall symptoms to improve QOL.    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    Time  6    Period  Weeks    Status  New    Target Date  11/24/18      PT LONG TERM GOAL #3   Title  Patient will score with <40% impairment on FOTO to demonstrate improved functional mobility.    Baseline  48% impairment    Time  6    Period  Weeks    Status  New    Target Date  11/24/18            Plan - 11/03/18 1531    Clinical Impression Statement  tolerated session well with minimal pain in hip noted end of session. Patient reporting this is the best he felt in a long time. Educated patient in self mobilization techniques and movements to perform at home. Will conitnue with hip and back mobilizations as tolerated to continue to improve joint and functional mobility.    Personal Factors and Comorbidities  Age;Comorbidity 1;Transportation    Comorbidities  HTN    Examination-Activity Limitations  Bend;Sit;Squat;Stairs;Stand    Examination-Participation Restrictions  Driving;Yard Work;Cleaning    Stability/Clinical Decision Making  Stable/Uncomplicated    Rehab Potential  Good    PT Frequency  2x / week    PT Duration  6 weeks    PT Treatment/Interventions  ADLs/Self Care Home Management;Aquatic Therapy;Biofeedback;Cryotherapy;Electrical Stimulation;Iontophoresis 4mg /ml Dexamethasone;Neuromuscular re-education;Patient/family education;Manual techniques;Dry needling;Passive range of motion;Scar mobilization;Therapeutic activities;Therapeutic exercise;Balance training;Moist Heat;Traction    PT Next Visit Plan  lumbar and hip mobilizations. review sit to stand transiiton (form), unloaded glut activation, assess gross lumbar mobility, core strength, assess balance, assess pelvic positioning (anterior/posterior pelvic tilt)    PT Home Exercise Plan  glut activation prior to standing from sitting or going up stairs- form focused; 10/30 cat dog, prone press ups, laying on towel roll in supine (in lumbar spine); self  mobilization    Consulted  and Agree with Plan of Care  Patient       Patient will benefit from skilled therapeutic intervention in order to improve the following deficits and impairments:  Abnormal gait, Decreased endurance, Pain, Decreased activity tolerance, Decreased strength, Decreased balance, Decreased mobility, Difficulty walking, Decreased range of motion  Visit Diagnosis: Difficulty in walking, not elsewhere classified  Pain in left hip     Problem List Patient Active Problem List   Diagnosis Date Noted  . Psoriatic arthritis ([place]) 03/27/2011  . Hyperlipidemia 03/27/2011  . Hypertension 03/27/2011    5:58 PM, 11/03/18 Jerene Pitch, DPT Physical Therapy with [facility]  [phone] office  [place] [address] [place], [place], [address] Phone: [phone]   Fax:  [phone]  Name: Douglas Reynolds MRN: [id number] Date of Birth: 12-26-1945

## 2018-11-04 ENCOUNTER — Encounter (HOSPITAL_COMMUNITY): Payer: Medicare Other | Admitting: Physical Therapy

## 2018-11-06 ENCOUNTER — Ambulatory Visit (HOSPITAL_COMMUNITY): Payer: Medicare Other | Admitting: Physical Therapy

## 2018-11-06 ENCOUNTER — Encounter (HOSPITAL_COMMUNITY): Payer: Self-pay | Admitting: Physical Therapy

## 2018-11-06 ENCOUNTER — Other Ambulatory Visit: Payer: Self-pay

## 2018-11-06 DIAGNOSIS — M25552 Pain in left hip: Secondary | ICD-10-CM

## 2018-11-06 DIAGNOSIS — R262 Difficulty in walking, not elsewhere classified: Secondary | ICD-10-CM | POA: Diagnosis not present

## 2018-11-06 NOTE — Therapy (Signed)
Douglas Reynolds, [place], [address] Phone: [phone]   Fax:  [phone]  Physical Therapy Treatment  Patient Details  Name: Douglas Reynolds MRN: [id number] Date of Birth: May 22, 1945 Referring Provider (PT): Marchia Bond   Encounter Date: 11/06/2018  PT End of Session - 11/06/18 1045    Visit Number  8    Number of Visits  12    Date for PT Re-Evaluation  11/03/18    Authorization Type  medicare and BCBS supplement    Authorization Time Period  10/13/18 to 11/24/2018    Authorization - Visit Number  8    Authorization - Number of Visits  12    PT Start Time  [id number]    PT Stop Time  1125    PT Time Calculation (min)  40 min    Activity Tolerance  Patient tolerated treatment well    Behavior During Therapy  [facility] for tasks assessed/performed       Past Medical History:  Diagnosis Date  . Dementia ([place])   . Hyperlipidemia 03/27/2011  . Hypertension 03/27/2011  . Psoriatic arthritis ([place]) 03/27/2011    History reviewed. No pertinent surgical history.  There were no vitals filed for this visit.  Subjective Assessment - 11/06/18 1301    Subjective  Staets he hasn't had any knee pain. States he felt good after last session and the next day. States that he still is having some low back/hip pain especially with standing up from bed first thing in the morning. States that it catches and he almost can't stand up. States that he does a bunch of exercises in the morning prior to getting out of bed  (some self prescribed). States that he does not do the ones on his stomach.    Currently in Pain?  Yes    Pain Score  3     Pain Location  Back    Pain Orientation  Left         [facility] PT Assessment - 11/06/18 0001      Assessment   Medical Diagnosis  left hip bursitis and balance difficulties    Referring Provider (PT)  Marchia Bond    Prior Therapy  none                   [facility]  Treatment/Exercise - 11/06/18 0001      Lumbar Exercises: Sidelying   Other Sidelying Lumbar Exercises  book stretch - hip at 60 adn at 63 - bilat x6, 10" holds each/      Lumbar Exercises: Prone   Other Prone Lumbar Exercises  prone press up - 5 minute hold      Manual Therapy   Manual Therapy  Joint mobilization;Manual Traction    Manual therapy comments  manual interventions performed independently from other interventions    Joint Mobilization  PA to left hip prone - figure 4 and just prone - grade III. lateral hip mobilization with belt grade III left hip, PA to lumbar spine grade III L4-5    Manual Traction  long axis traction of Left hip - no change in symptomd during, mild increase in back pain after             PT Education - 11/06/18 1303    Education Details  on performing lumbar extension in bed prior to getting up every day to see if it makes a difference with this transtion out of bed  Person(s) Educated  Patient    Methods  Explanation    Comprehension  Verbalized understanding       PT Short Term Goals - 10/13/18 1638      PT SHORT TERM GOAL #1   Title  Patient will be able to report at least 25% improvement in overall symptoms to improve QOL.    Baseline  0%    Time  3    Period  Weeks    Status  New    Target Date  11/03/18      PT SHORT TERM GOAL #2   Title  Patient will report not needed to ascend stairs on all fours over the last week to demonstrate improved functional mobility.    Time  3    Period  Weeks    Status  New    Target Date  11/03/18      PT SHORT TERM GOAL #3   Title  Patient will be independent in HEP to improve functional outcomes    Time  3    Period  Weeks    Status  New    Target Date  11/03/18        PT Long Term Goals - 10/13/18 1639      PT LONG TERM GOAL #1   Title  Patient will be able to report at least 50% improvement in overall symptoms to improve QOL.    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    Time  6    Period  Weeks    Status  New    Target Date  11/24/18      PT LONG TERM GOAL #3   Title  Patient will score with <40% impairment on FOTO to demonstrate improved functional mobility.    Baseline  48% impairment    Time  6    Period  Weeks    Status  New    Target Date  11/24/18            Plan - 11/06/18 1301    Clinical Impression Statement  Focus was on mobilization. No change in symptoms with lateral hip mobilization or hip traction. Improvement noted with PA to hip. Discussed lumbar extension exercise in isolation first thing in the morning prior to getting out of bed to determine if this helps with his transition out of bed. Patient unsure if he would remember to do it so a paper with the exercise (previously provided) was provided again today with specific instructions to improve compliance. Will follow up with transitional pain next session and compliance to prone press ups. Patient would continue to benefit from skilled physical therapy.    Personal Factors and Comorbidities  Age;Comorbidity 1;Transportation    Comorbidities  HTN    Examination-Activity Limitations  Bend;Sit;Squat;Stairs;Stand    Examination-Participation Restrictions  Driving;Yard Work;Cleaning    Stability/Clinical Decision Making  Stable/Uncomplicated    Rehab Potential  Good    PT Frequency  2x / week    PT Duration  6 weeks    PT Treatment/Interventions  ADLs/Self Care Home Management;Aquatic Therapy;Biofeedback;Cryotherapy;Electrical Stimulation;Iontophoresis 4mg /ml Dexamethasone;Neuromuscular  re-education;Patient/family education;Manual techniques;Dry needling;Passive range of motion;Scar mobilization;Therapeutic activities;Therapeutic exercise;Balance  training;Moist Heat;Traction    PT Next Visit Plan  lumbar and hip mobilizations. review sit to stand transiiton (form), unloaded glut activation, assess gross lumbar mobility, core strength, assess balance, assess pelvic positioning (anterior/posterior pelvic tilt)    PT Home Exercise Plan  glut activation prior to standing from sitting or going up stairs- form focused; 10/30 cat dog, prone press ups, laying on towel roll in supine (in lumbar spine); self mobilization    Consulted and Agree with Plan of Care  Patient       Patient will benefit from skilled therapeutic intervention in order to improve the following deficits and impairments:  Abnormal gait, Decreased endurance, Pain, Decreased activity tolerance, Decreased strength, Decreased balance, Decreased mobility, Difficulty walking, Decreased range of motion  Visit Diagnosis: Difficulty in walking, not elsewhere classified  Pain in left hip     Problem List Patient Active Problem List   Diagnosis Date Noted  . Psoriatic arthritis ([place]) 03/27/2011  . Hyperlipidemia 03/27/2011  . Hypertension 03/27/2011    1:12 PM, 11/06/18 Jerene Pitch, DPT Physical Therapy with [facility]  [phone] office  [place] [address] [place], [place], [address] Phone: [phone]   Fax:  [phone]  Name: Douglas Reynolds MRN: [id number] Date of Birth: 31-Jan-1945

## 2018-11-11 ENCOUNTER — Ambulatory Visit (HOSPITAL_COMMUNITY): Payer: Medicare Other | Admitting: Physical Therapy

## 2018-11-11 ENCOUNTER — Encounter (HOSPITAL_COMMUNITY): Payer: Self-pay | Admitting: Physical Therapy

## 2018-11-11 ENCOUNTER — Other Ambulatory Visit: Payer: Self-pay

## 2018-11-11 DIAGNOSIS — M25552 Pain in left hip: Secondary | ICD-10-CM | POA: Diagnosis not present

## 2018-11-11 DIAGNOSIS — R262 Difficulty in walking, not elsewhere classified: Secondary | ICD-10-CM | POA: Diagnosis not present

## 2018-11-11 NOTE — Therapy (Signed)
[place], [place], [address] Phone: [phone]   Fax:  [phone]  Physical Therapy Treatment  Patient Details  Name: Douglas Reynolds MRN: [id number] Date of Birth: 25-Jun-1945 Referring Provider (PT): Marchia Bond   Encounter Date: 11/11/2018  PT End of Session - 11/11/18 1141    Visit Number  9    Number of Visits  12    Date for PT Re-Evaluation  11/03/18    Authorization Type  medicare and BCBS supplement    Authorization Time Period  10/13/18 to 11/24/2018    Authorization - Visit Number  9    Authorization - Number of Visits  12    PT Start Time  1132    PT Stop Time  1215    PT Time Calculation (min)  43 min    Activity Tolerance  Patient tolerated treatment well    Behavior During Therapy  [facility] for tasks assessed/performed       Past Medical History:  Diagnosis Date  . Dementia ([place])   . Hyperlipidemia 03/27/2011  . Hypertension 03/27/2011  . Psoriatic arthritis ([place]) 03/27/2011    History reviewed. No pertinent surgical history.  There were no vitals filed for this visit.  Subjective Assessment - 11/11/18 1134    Subjective  States he had a couple of really good days at first, was able to walk around the house without a cane, minor twinges. Really looking forward to do things and the next day he hurt like heck and he had not done anything that should have triggered it. 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 States this morning he felt pain in his neck shoulder and knee (today). States he took pain killers this morning. States overall he feels 50% better because before PT he didn't have any good days and now he is.    Currently in Pain?  Yes    Pain Score  5     Pain Location  Back   knee, hip and shoulder   Pain Orientation  Right    Pain Descriptors / Indicators  Aching                       [facility] Adult PT Treatment/Exercise - 11/11/18 0001      Lumbar Exercises: Prone   Other Prone Lumbar Exercises  prone press up 2x 4      Manual Therapy   Manual Therapy  Joint mobilization;Soft tissue mobilization;Manual Traction    Manual therapy comments  manual interventions performed independently from other interventions    Joint Mobilization  PA to lumbar spine grade II, Pa to sacral base and hip L grade II.     Soft tissue mobilization  STM and IASTM wiht percussion gun to lumbar/thoracic paraspinals, glutes and QL - bilat - tolerated well     Manual Traction  lumabr traction - PT performed with bed sheet - 6 minutes continuous             PT Education - 11/11/18 1242    Education Details  on lumbar exercises, self massage to right cervical paraspinals (painful today) -lacrosse ball mobilization and gentle ROM in cervical and lumbar to improve tolerated mobility    Person(s) Educated  Patient    Methods  Explanation    Comprehension  Verbalized  understanding       PT Short Term Goals - 11/11/18 1142      PT SHORT TERM GOAL #1   Title  Patient will be able to report at least 25% improvement in overall symptoms to improve QOL.    Baseline  11/10 50% better    Time  3    Period  Weeks    Status  Achieved    Target Date  11/03/18      PT SHORT TERM GOAL #2   Title  Patient will report not needed to ascend stairs on all fours over the last week to demonstrate improved functional mobility.    Time  3    Period  Weeks    Status  On-going    Target Date  11/03/18      PT SHORT TERM GOAL #3   Title  Patient will be independent in HEP to improve functional outcomes    Baseline  11/10 met    Time  3    Period  Weeks    Status  Achieved    Target Date  11/03/18        PT Long Term Goals - 11/11/18 1238      PT LONG TERM GOAL #1   Title  Patient will be able to report at least 50% improvement in overall symptoms to improve QOL.     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    Time  6    Period  Weeks    Status  On-going      PT LONG TERM GOAL #3   Title  Patient will score with <40% impairment on FOTO to demonstrate improved functional mobility.    Baseline  48% impairment    Time  6    Period  Weeks    Status  On-going            Plan - 11/11/18 1243    Clinical Impression Statement  Toerated session well reporting only occassional twinge in low back and hip by end of session (which he reports is normal for him), but continued pain in cervical spine (focus was on lumbar spine today). Educated patient in self mobilization to right cervical area where taut muscle was palpated where pain was. Instructed aptietn on gentle cervical and lumbar ROM as tolerated to improve functioanl mobility.    Personal Factors and Comorbidities  Age;Comorbidity 1;Transportation    Comorbidities  HTN    Examination-Activity Limitations  Bend;Sit;Squat;Stairs;Stand    Examination-Participation Restrictions  Driving;Yard Work;Cleaning    Stability/Clinical Decision Making  Stable/Uncomplicated    Rehab Potential  Good    PT Frequency  2x / week    PT Duration  6 weeks    PT Treatment/Interventions  ADLs/Self Care Home Management;Aquatic Therapy;Biofeedback;Cryotherapy;Electrical Stimulation;Iontophoresis '4mg'$ /ml Dexamethasone;Neuromuscular re-education;Patient/family education;Manual techniques;Dry needling;Passive range of motion;Scar mobilization;Therapeutic activities;Therapeutic exercise;Balance training;Moist Heat;Traction    PT Next Visit Plan  PN next session, percussion gun PRN, lumbar and hip mobilizations. review sit to stand transiiton (form), unloaded glut activation, assess  gross lumbar mobility, core strength, assess balance, assess pelvic positioning (anterior/posterior pelvic tilt)    PT  Home Exercise Plan  glut activation prior to standing from sitting or going up stairs- form focused; 10/30 cat dog, prone press ups, laying on towel roll in supine (in lumbar spine); self mobilization    Consulted and Agree with Plan of Care  Patient       Patient will benefit from skilled therapeutic intervention in order to improve the following deficits and impairments:  Abnormal gait, Decreased endurance, Pain, Decreased activity tolerance, Decreased strength, Decreased balance, Decreased mobility, Difficulty walking, Decreased range of motion  Visit Diagnosis: Pain in left hip  Difficulty in walking, not elsewhere classified     Problem List Patient Active Problem List   Diagnosis Date Noted  . Psoriatic arthritis ([place]) 03/27/2011  . Hyperlipidemia 03/27/2011  . Hypertension 03/27/2011   12:45 PM, 11/11/18 Jerene Pitch, DPT Physical Therapy with [facility]  [phone] office  [place] [address] [place], [place], [address] Phone: [phone]   Fax:  [phone]  Name: GOMER FRANCE MRN: [id number] Date of Birth: Nov 02, 1945

## 2018-11-14 ENCOUNTER — Ambulatory Visit (HOSPITAL_COMMUNITY): Payer: Medicare Other | Admitting: Physical Therapy

## 2018-11-14 ENCOUNTER — Other Ambulatory Visit: Payer: Self-pay

## 2018-11-14 DIAGNOSIS — M25552 Pain in left hip: Secondary | ICD-10-CM | POA: Diagnosis not present

## 2018-11-14 DIAGNOSIS — R262 Difficulty in walking, not elsewhere classified: Secondary | ICD-10-CM

## 2018-11-14 NOTE — Therapy (Signed)
[facility] [address] [place], [place], [address] Phone: [phone]   Fax:  [phone]  Physical Therapy Treatment  Patient Details  Name: Douglas Reynolds MRN: [id number] Date of Birth: Feb 28, 1945 Referring Provider (PT): Marchia Bond  Progress Note Reporting Period 10/13/18 to 11/14/18  See note below for Objective Data and Assessment of Progress/Goals.      Encounter Date: 11/14/2018  PT End of Session - 11/14/18 1142    Visit Number  10    Number of Visits  12    Date for PT Re-Evaluation  11/03/18    Authorization Type  medicare and BCBS supplement    Authorization Time Period  10/13/18 to 11/24/2018    Authorization - Visit Number  10    Authorization - Number of Visits  12    PT Start Time  1130    PT Stop Time  1210    PT Time Calculation (min)  40 min    Activity Tolerance  Patient tolerated treatment well    Behavior During Therapy  [facility] for tasks assessed/performed       Past Medical History:  Diagnosis Date  . Dementia ([place])   . Hyperlipidemia 03/27/2011  . Hypertension 03/27/2011  . Psoriatic arthritis ([place]) 03/27/2011    No past surgical history on file.  There were no vitals filed for this visit.  Subjective Assessment - 11/14/18 1133    Subjective  States that his lef thip is bothering him but otherwise he is doing great states he has been doing great since his last session. Did not feel that he needed a cane today.    Currently in Pain?  Yes    Pain Score  3     Pain Location  Hip    Pain Orientation  Left         [facility] PT Assessment - 11/14/18 0001      Assessment   Medical Diagnosis  left hip bursitis and balance difficulties    Referring Provider (PT)  Marchia Bond    Prior Therapy  none      Observation/Other Assessments   Focus on Therapeutic Outcomes (FOTO)   22% impaired   48% impaired     Strength   Right Hip Flexion  4+/5    Right Hip Extension  5/5    Right Hip  ABduction  5/5    Left Hip Flexion  4+/5   little pain in hip   Left Hip Extension  4/5   pain in hip   Left Hip ABduction  4+/5   pain in hip                  [facility] Adult PT Treatment/Exercise - 11/14/18 0001      Manual Therapy   Manual Therapy  Soft tissue mobilization    Manual therapy comments  manual interventions performed independently from other interventions    Soft tissue mobilization  STM and IASTM with percussion gun to lumbar/thoracic paraspinals, glutes and QL - bilat - tolerated well - less pain noted afterwards             PT Education - 11/14/18 1221    Education Details  on use of percussion gun at home and what areas to target.    Person(s) Educated  Patient    Methods  Explanation    Comprehension  Verbalized understanding       PT Short Term Goals - 11/11/18 1142  PT SHORT TERM GOAL #1   Title  Patient will be able to report at least 25% improvement in overall symptoms to improve QOL.    Baseline  11/10 50% better    Time  3    Period  Weeks    Status  Achieved    Target Date  11/03/18      PT SHORT TERM GOAL #2   Title  Patient will report not needed to ascend stairs on all fours over the last week to demonstrate improved functional mobility.    Time  3    Period  Weeks    Status  On-going    Target Date  11/03/18      PT SHORT TERM GOAL #3   Title  Patient will be independent in HEP to improve functional outcomes    Baseline  11/10 met    Time  3    Period  Weeks    Status  Achieved    Target Date  11/03/18        PT Long Term Goals - 11/14/18 1218      PT LONG TERM GOAL #1   Title  Patient will be able to report at least 50% improvement in overall symptoms to improve QOL.    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    Baseline  11/13 - able to perform in clinic    Time  6    Period  Weeks    Status  Achieved      PT LONG TERM GOAL #3   Title  Patient will score with <40% impairment on FOTO to demonstrate improved functional mobility.    Baseline  11/14/18 22% impairment    Time  6    Period  Weeks    Status  Achieved      PT LONG TERM GOAL #4   Title  Patient will be independent in HEP in regards to managing his symptoms at home when he has an exacerbation.    Time  1    Period  Weeks    Status  New    Target Date  11/21/18            Plan - 11/14/18 1220    Clinical Impression Statement  Progress note performed on this date. Patient reports today he feels 90% better since the start of physical therapy. Educated pateitn on use of massage gun secondary to personal interest in obtaining one. Performed instrument assisted soft tissue mobilization to left hip and symptoms decreased afterwards. Improved gait mechanics and walking speed noted at end of session. Discussed continued exercises and self management of symptoms. Will continue to benefit from skilled physical therapy to ensure independence in self management of symptoms. All goals met at this time, added addiitonal goal to POC for following week.    Personal Factors and Comorbidities  Age;Comorbidity 1;Transportation    Comorbidities  HTN    Examination-Activity Limitations  Bend;Sit;Squat;Stairs;Stand    Examination-Participation Restrictions  Driving;Yard Work;Cleaning    Stability/Clinical Decision Making  Stable/Uncomplicated    Rehab Potential  Good  PT Frequency  2x / week    PT Duration  6 weeks    PT Treatment/Interventions  ADLs/Self Care Home Management;Aquatic Therapy;Biofeedback;Cryotherapy;Electrical Stimulation;Iontophoresis '4mg'$ /ml Dexamethasone;Neuromuscular re-education;Patient/family education;Manual techniques;Dry needling;Passive range  of motion;Scar mobilization;Therapeutic activities;Therapeutic exercise;Balance training;Moist Heat;Traction    PT Next Visit Plan  percussion gun PRN, lumbar and hip mobilizations. review sit to stand transiiton (form), unloaded glut activation, assess gross lumbar mobility, core strength, assess balance, assess pelvic positioning (anterior/posterior pelvic tilt)    PT Home Exercise Plan  glut activation prior to standing from sitting or going up stairs- form focused; 10/30 cat dog, prone press ups, laying on towel roll in supine (in lumbar spine); self mobilization with LAX ball and percussion gun    Consulted and Agree with Plan of Care  Patient       Patient will benefit from skilled therapeutic intervention in order to improve the following deficits and impairments:  Abnormal gait, Decreased endurance, Pain, Decreased activity tolerance, Decreased strength, Decreased balance, Decreased mobility, Difficulty walking, Decreased range of motion  Visit Diagnosis: Pain in left hip  Difficulty in walking, not elsewhere classified     Problem List Patient Active Problem List   Diagnosis Date Noted  . Psoriatic arthritis ([place]) 03/27/2011  . Hyperlipidemia 03/27/2011  . Hypertension 03/27/2011   12:42 PM, 11/14/18 Jerene Pitch, DPT Physical Therapy with [facility]  [phone] office  [place] [address] [place], [place], [address] Phone: [phone]   Fax:  [phone]  Name: Douglas Reynolds MRN: [id number] Date of Birth: Nov 05, 1945

## 2018-11-17 ENCOUNTER — Other Ambulatory Visit: Payer: Self-pay

## 2018-11-17 ENCOUNTER — Encounter (HOSPITAL_COMMUNITY): Payer: Self-pay | Admitting: Physical Therapy

## 2018-11-17 ENCOUNTER — Ambulatory Visit (HOSPITAL_COMMUNITY): Payer: Medicare Other | Admitting: Physical Therapy

## 2018-11-17 DIAGNOSIS — M25552 Pain in left hip: Secondary | ICD-10-CM | POA: Diagnosis not present

## 2018-11-17 DIAGNOSIS — R262 Difficulty in walking, not elsewhere classified: Secondary | ICD-10-CM | POA: Diagnosis not present

## 2018-11-17 NOTE — Therapy (Signed)
[place], [place], [address] Phone: [phone]   Fax:  [phone]  Physical Therapy Treatment  Patient Details  Name: Douglas Reynolds MRN: [id number] Date of Birth: 10-04-1945 Referring Provider (PT): Marchia Bond   Encounter Date: 11/17/2018  PT End of Session - 11/17/18 1137    Visit Number  11    Number of Visits  12    Date for PT Re-Evaluation  11/03/18    Authorization Type  medicare and BCBS supplement    Authorization Time Period  10/13/18 to 11/24/2018    Authorization - Visit Number  1    Authorization - Number of Visits  10    Activity Tolerance  Patient tolerated treatment well    Behavior During Therapy  [facility] for tasks assessed/performed       Past Medical History:  Diagnosis Date  . Dementia ([place])   . Hyperlipidemia 03/27/2011  . Hypertension 03/27/2011  . Psoriatic arthritis ([place]) 03/27/2011    History reviewed. No pertinent surgical history.  There were no vitals filed for this visit.  Subjective Assessment - 11/17/18 1135    Subjective  States he felt great on saturday and he was able to almost complete the well house. STates he has not been using the cane because he feels confident he won't stumble. States he has some hip pain on sunday so he let himself rest. Reports mild discomfort today. Rerports percussion gun should be coming in today or tomorrow.    Currently in Pain?  Yes    Pain Score  2     Pain Location  Hip    Pain Orientation  Left         [facility] PT Assessment - 11/17/18 0001      Assessment   Medical Diagnosis  left hip bursitis and balance difficulties    Referring Provider (PT)  Marchia Bond                   [facility] Adult PT Treatment/Exercise - 11/17/18 0001      Lumbar Exercises: Supine   Other Supine Lumbar Exercises  thomas stretch 15" holds, x5 Bilat      Lumbar Exercises: Sidelying   Clam  Both;5 seconds;10 reps   3 sets     Lumbar Exercises: Quadruped   Other Quadruped Lumbar Exercises  child's pose x10, 15" holds    Other Quadruped Lumbar Exercises  hip hinge in kneeling 2x10, 5" holds ; thread the needls V61, 15" holds B             PT Education - 11/17/18 1208    Education Details  educated patient on HEP, frequency/intensity and continued benefits with daily movement program.    Person(s) Educated  Patient    Methods  Explanation    Comprehension  Verbalized understanding       PT Short Term Goals - 11/11/18 1142      PT SHORT TERM GOAL #1   Title  Patient will be able to report at least 25% improvement in overall symptoms to improve QOL.    Baseline  11/10 50% better    Time  3    Period  Weeks    Status  Achieved    Target Date  11/03/18      PT SHORT TERM GOAL #2   Title  Patient will report not needed to ascend stairs on all fours over the last week to demonstrate improved functional  mobility.    Time  3    Period  Weeks    Status  On-going    Target Date  11/03/18      PT SHORT TERM GOAL #3   Title  Patient will be independent in HEP to improve functional outcomes    Baseline  11/10 met    Time  3    Period  Weeks    Status  Achieved    Target Date  11/03/18        PT Long Term Goals - 11/14/18 1218      PT LONG TERM GOAL #1   Title  Patient will be able to report at least 50% improvement in overall symptoms to improve QOL.    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    Baseline  11/13 - able to perform in clinic    Time  6    Period  Weeks    Status  Achieved      PT LONG TERM GOAL #3   Title  Patient will score with <40% impairment on FOTO to demonstrate improved functional mobility.     Baseline  11/14/18 22% impairment    Time  6    Period  Weeks    Status  Achieved      PT LONG TERM GOAL #4   Title  Patient will be independent in HEP in regards to managing his symptoms at home when he has an exacerbation.    Time  1    Period  Weeks    Status  New    Target Date  11/21/18            Plan - 11/17/18 1204    Clinical Impression Statement  Tolerated session very well today. Added strengthening exercises and all but clamshells were painfree. Clamshells were only painful at end range of motion, instructed patient to stop shy of pain and perform in painfree ROM. Answered all questions about progress. Anticipate next visit to be patient's last session pending patient presentation. Patient would continue to benefit from skilled physical therapy focusing on strong HEP.    Personal Factors and Comorbidities  Age;Comorbidity 1;Transportation    Comorbidities  HTN    Examination-Activity Limitations  Bend;Sit;Squat;Stairs;Stand    Examination-Participation Restrictions  Driving;Yard Work;Cleaning    Stability/Clinical Decision Making  Stable/Uncomplicated    Rehab Potential  Good    PT Frequency  2x / week    PT Duration  6 weeks    PT Treatment/Interventions  ADLs/Self Care Home Management;Aquatic Therapy;Biofeedback;Cryotherapy;Electrical Stimulation;Iontophoresis 66m/ml Dexamethasone;Neuromuscular re-education;Patient/family education;Manual techniques;Dry needling;Passive range of motion;Scar mobilization;Therapeutic activities;Therapeutic exercise;Balance training;Moist Heat;Traction    PT Next Visit Plan  percussion gun PRN, lumbar and hip mobilizations. review sit to stand transiiton (form), unloaded glut activation, assess gross lumbar mobility, core strength, assess balance, assess pelvic positioning (anterior/posterior pelvic tilt)    PT Home Exercise Plan  glut activation prior to standing from sitting or going up stairs- form focused; 10/30 cat dog, prone press ups,  laying on towel roll in supine (in lumbar spine); self mobilization with LAX ball and percussion  gun; 11/16 clamshells, child's pose, thread the needle    Consulted and Agree with Plan of Care  Patient       Patient will benefit from skilled therapeutic intervention in order to improve the following deficits and impairments:  Abnormal gait, Decreased endurance, Pain, Decreased activity tolerance, Decreased strength, Decreased balance, Decreased mobility, Difficulty walking, Decreased range of motion  Visit Diagnosis: Pain in left hip  Difficulty in walking, not elsewhere classified     Problem List Patient Active Problem List   Diagnosis Date Noted  . Psoriatic arthritis ([place]) 03/27/2011  . Hyperlipidemia 03/27/2011  . Hypertension 03/27/2011   12:09 PM, 11/17/18 Jerene Pitch, DPT Physical Therapy with [facility]  [phone] office  [place] [address] [place], [place], [address] Phone: [phone]   Fax:  [phone]  Name: Douglas Reynolds MRN: [id number] Date of Birth: 1945/09/02

## 2018-11-21 ENCOUNTER — Encounter (HOSPITAL_COMMUNITY): Payer: Self-pay | Admitting: Physical Therapy

## 2018-11-21 ENCOUNTER — Ambulatory Visit (HOSPITAL_COMMUNITY): Payer: Medicare Other | Admitting: Physical Therapy

## 2018-11-21 ENCOUNTER — Other Ambulatory Visit: Payer: Self-pay

## 2018-11-21 DIAGNOSIS — M25552 Pain in left hip: Secondary | ICD-10-CM

## 2018-11-21 DIAGNOSIS — R262 Difficulty in walking, not elsewhere classified: Secondary | ICD-10-CM

## 2018-11-21 NOTE — Therapy (Signed)
[place] [address] [place], [place], [address] Phone: [phone]   Fax:  [phone]  Physical Therapy Treatment and Discharge Note  Patient Details  Name: Douglas Reynolds MRN: [id number] Date of Birth: [birth year] Referring Provider (PT): Marchia Bond  PHYSICAL THERAPY DISCHARGE SUMMARY  Visits from Start of Care: 12  Current functional level related to goals / functional outcomes: All goals except recent goal with HEP managing symptoms.    Remaining deficits: Continued stiffness in hip/back    Education / Equipment: On HEP, use of percussion gun anatomy and areas to target  Plan: Patient agrees to discharge.  Patient goals were partially met. Patient is being discharged due to meeting the stated rehab goals.  ?????        Encounter Date: 11/21/2018  PT End of Session - 11/21/18 1015    Visit Number  12    Number of Visits  12    Date for PT Re-Evaluation  11/03/18    Authorization Type  medicare and BCBS supplement    Authorization Time Period  10/13/18 to 11/24/2018    Authorization - Visit Number  2    Authorization - Number of Visits  10    PT Start Time  1000    PT Stop Time  1038    PT Time Calculation (min)  38 min    Activity Tolerance  Patient tolerated treatment well    Behavior During Therapy  [facility]FL for tasks assessed/performed       Past Medical History:  Diagnosis Date  . Dementia ([place])   . Hyperlipidemia 03/27/2011  . Hypertension 03/27/2011  . Psoriatic arthritis ([place]) 03/27/2011    History reviewed. No pertinent surgical history.  There were no vitals filed for this visit.  Subjective Assessment - 11/21/18 1001    Subjective  States since last visit that when your sit for about 10 minutes everything tightens up in the hip and that it gets better but that it is frustrating. States that he has to go up the ladder step wiht his right leg first. Once he exercises and works it out it was  staying that way but this week has been challening. States despite this he doesn't need to use the cane and that he still feels 90% better.         [facility] PT Assessment - 11/21/18 0001      Assessment   Medical Diagnosis  left hip bursitis and balance difficulties    Referring Provider (PT)  Marchia Bond    Prior Therapy  none                   [facility] Adult PT Treatment/Exercise - 11/21/18 0001      Manual Therapy   Manual Therapy  Soft tissue mobilization    Manual therapy comments  manual interventions performed independently from other interventions    Soft tissue mobilization  STM and IASTM to Left glutes, piriformis, sacral border and lumbar paraspinals             PT Education - 11/21/18 1046    Education Details  on exercises, immobilty, causes of stiffness (pressure changes, temperature changes, activity level), habitual postures (favors R SB and flexion in seated and standing position). Educated patient on being more aware of habitual postures and verbally cued patient to correct 7 times in 20 minutes period. Discussed frequency/dose of exercises and massage gun.    Person(s) Educated  Patient  Methods  Explanation    Comprehension  Verbalized understanding       PT Short Term Goals - 11/11/18 1142      PT SHORT TERM GOAL #1   Title  Patient will be able to report at least 25% improvement in overall symptoms to improve QOL.    Baseline  11/10 50% better    Time  3    Period  Weeks    Status  Achieved    Target Date  11/03/18      PT SHORT TERM GOAL #2   Title  Patient will report not needed to ascend stairs on all fours over the last week to demonstrate improved functional mobility.    Time  3    Period  Weeks    Status  On-going    Target Date  11/03/18      PT SHORT TERM GOAL #3   Title  Patient will be independent in HEP to improve functional outcomes    Baseline  11/10 met    Time  3    Period  Weeks    Status  Achieved    Target  Date  11/03/18        PT Long Term Goals - 11/21/18 1016      PT LONG TERM GOAL #1   Title  Patient will be able to report at least 50% improvement in overall symptoms to improve QOL.    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    Baseline  11/13 - able to perform in clinic    Time  6    Period  Weeks    Status  Achieved      PT LONG TERM GOAL #3   Title  Patient will score with <40% impairment on FOTO to demonstrate improved functional mobility.    Baseline  11/14/18 22% impairment    Time  6    Period  Weeks    Status  Achieved      PT LONG TERM GOAL #4   Title  Patient will be independent in HEP in regards to managing his symptoms at home when he has an exacerbation.    Time  1    Period  Weeks    Status  On-going            Plan - 11/21/18 1049    Clinical Impression Statement  Patient presents to therapy with slight increase in stiffness. Focused on educating patient on appropriate dosage and frequency of exercises and self mobilization. Reviewed exercises, habitual posture, causes for stiffness and answered all questions prior to end of today's session. Patient felt minimal stiffness and pain by end of session and confident in being able to manage symptoms independently at home. At this time patient to discharge from physical therapy to HEP.    Personal Factors and Comorbidities  Age;Comorbidity 1;Transportation    Comorbidities  HTN    Examination-Activity Limitations  Bend;Sit;Squat;Stairs;Stand    Examination-Participation Restrictions  Driving;Yard Work;Cleaning    Stability/Clinical Decision Making  Stable/Uncomplicated    Rehab Potential  Good    PT Frequency  2x / week    PT Duration  6  weeks    PT Treatment/Interventions  ADLs/Self Care Home Management;Aquatic Therapy;Biofeedback;Cryotherapy;Electrical Stimulation;Iontophoresis 58m/ml Dexamethasone;Neuromuscular re-education;Patient/family education;Manual techniques;Dry needling;Passive range of motion;Scar mobilization;Therapeutic activities;Therapeutic exercise;Balance training;Moist Heat;Traction    PT Next Visit Plan  patient to discharge from physical therapy to HEP.    PT Home Exercise Plan  glut activation prior to standing from sitting or going up stairs- form focused; 10/30 cat dog, prone press ups, laying on towel roll in supine (in lumbar spine); self mobilization with LAX ball and percussion gun; 11/16 clamshells, child's pose, thread the needle    Consulted and Agree with Plan of Care  Patient       Patient will benefit from skilled therapeutic intervention in order to improve the following deficits and impairments:  Abnormal gait, Decreased endurance, Pain, Decreased activity tolerance, Decreased strength, Decreased balance, Decreased mobility, Difficulty walking, Decreased range of motion  Visit Diagnosis: Pain in left hip  Difficulty in walking, not elsewhere classified     Problem List Patient Active Problem List   Diagnosis Date Noted  . [place] 03/27/2011  . Hyperlipidemia 03/27/2011  . Hypertension 03/27/2011    11:03 AM, 11/21/18 MJerene Pitch DPT [facility] [phone]  [place] [address]Phone: [phone]  Fax:  [phone] Name: KELAM ELLISMRN: [id number] of Birth: 1Mar 15, 1947

## 2018-11-25 ENCOUNTER — Encounter (HOSPITAL_COMMUNITY): Payer: Self-pay | Admitting: Physical Therapy

## 2018-12-02 ENCOUNTER — Encounter (HOSPITAL_COMMUNITY): Payer: Self-pay | Admitting: Physical Therapy

## 2018-12-05 ENCOUNTER — Encounter (HOSPITAL_COMMUNITY): Payer: Self-pay | Admitting: Physical Therapy

## 2018-12-09 ENCOUNTER — Encounter (HOSPITAL_COMMUNITY): Payer: Self-pay | Admitting: Physical Therapy

## 2018-12-12 ENCOUNTER — Encounter (HOSPITAL_COMMUNITY): Payer: Self-pay | Admitting: Physical Therapy

## 2019-01-21 DIAGNOSIS — Z23 Encounter for immunization: Secondary | ICD-10-CM | POA: Diagnosis not present

## 2019-02-18 DIAGNOSIS — Z23 Encounter for immunization: Secondary | ICD-10-CM | POA: Diagnosis not present

## 2019-03-23 DIAGNOSIS — C4441 Basal cell carcinoma of skin of scalp and neck: Secondary | ICD-10-CM | POA: Diagnosis not present

## 2019-03-23 DIAGNOSIS — L570 Actinic keratosis: Secondary | ICD-10-CM | POA: Diagnosis not present

## 2019-03-23 DIAGNOSIS — B078 Other viral warts: Secondary | ICD-10-CM | POA: Diagnosis not present

## 2019-03-23 DIAGNOSIS — D225 Melanocytic nevi of trunk: Secondary | ICD-10-CM | POA: Diagnosis not present

## 2019-03-23 DIAGNOSIS — D045 Carcinoma in situ of skin of trunk: Secondary | ICD-10-CM | POA: Diagnosis not present

## 2019-03-23 DIAGNOSIS — X32XXXD Exposure to sunlight, subsequent encounter: Secondary | ICD-10-CM | POA: Diagnosis not present

## 2019-03-23 DIAGNOSIS — L400 Psoriasis vulgaris: Secondary | ICD-10-CM | POA: Diagnosis not present

## 2019-04-17 DIAGNOSIS — F0280 Dementia in other diseases classified elsewhere without behavioral disturbance: Secondary | ICD-10-CM | POA: Diagnosis not present

## 2019-04-17 DIAGNOSIS — M545 Low back pain: Secondary | ICD-10-CM | POA: Diagnosis not present

## 2019-04-17 DIAGNOSIS — G3184 Mild cognitive impairment, so stated: Secondary | ICD-10-CM | POA: Diagnosis not present

## 2019-04-17 DIAGNOSIS — F419 Anxiety disorder, unspecified: Secondary | ICD-10-CM | POA: Diagnosis not present

## 2019-04-17 DIAGNOSIS — M791 Myalgia, unspecified site: Secondary | ICD-10-CM | POA: Diagnosis not present

## 2019-04-17 DIAGNOSIS — I10 Essential (primary) hypertension: Secondary | ICD-10-CM | POA: Diagnosis not present

## 2019-04-17 DIAGNOSIS — E782 Mixed hyperlipidemia: Secondary | ICD-10-CM | POA: Diagnosis not present

## 2019-04-17 DIAGNOSIS — M25569 Pain in unspecified knee: Secondary | ICD-10-CM | POA: Diagnosis not present

## 2019-04-17 DIAGNOSIS — L4052 Psoriatic arthritis mutilans: Secondary | ICD-10-CM | POA: Diagnosis not present

## 2019-04-17 DIAGNOSIS — H8110 Benign paroxysmal vertigo, unspecified ear: Secondary | ICD-10-CM | POA: Diagnosis not present

## 2019-04-17 DIAGNOSIS — J111 Influenza due to unidentified influenza virus with other respiratory manifestations: Secondary | ICD-10-CM | POA: Diagnosis not present

## 2019-04-17 DIAGNOSIS — M25559 Pain in unspecified hip: Secondary | ICD-10-CM | POA: Diagnosis not present

## 2019-04-21 DIAGNOSIS — R945 Abnormal results of liver function studies: Secondary | ICD-10-CM | POA: Diagnosis not present

## 2019-04-21 DIAGNOSIS — L4052 Psoriatic arthritis mutilans: Secondary | ICD-10-CM | POA: Diagnosis not present

## 2019-04-21 DIAGNOSIS — J310 Chronic rhinitis: Secondary | ICD-10-CM | POA: Diagnosis not present

## 2019-04-21 DIAGNOSIS — M545 Low back pain: Secondary | ICD-10-CM | POA: Diagnosis not present

## 2019-04-21 DIAGNOSIS — R438 Other disturbances of smell and taste: Secondary | ICD-10-CM | POA: Diagnosis not present

## 2019-04-21 DIAGNOSIS — I10 Essential (primary) hypertension: Secondary | ICD-10-CM | POA: Diagnosis not present

## 2019-04-21 DIAGNOSIS — E782 Mixed hyperlipidemia: Secondary | ICD-10-CM | POA: Diagnosis not present

## 2019-04-21 DIAGNOSIS — G3184 Mild cognitive impairment, so stated: Secondary | ICD-10-CM | POA: Diagnosis not present

## 2019-04-27 DIAGNOSIS — Z08 Encounter for follow-up examination after completed treatment for malignant neoplasm: Secondary | ICD-10-CM | POA: Diagnosis not present

## 2019-04-27 DIAGNOSIS — L400 Psoriasis vulgaris: Secondary | ICD-10-CM | POA: Diagnosis not present

## 2019-04-27 DIAGNOSIS — L988 Other specified disorders of the skin and subcutaneous tissue: Secondary | ICD-10-CM | POA: Diagnosis not present

## 2019-04-27 DIAGNOSIS — Z85828 Personal history of other malignant neoplasm of skin: Secondary | ICD-10-CM | POA: Diagnosis not present

## 2019-04-27 DIAGNOSIS — D485 Neoplasm of uncertain behavior of skin: Secondary | ICD-10-CM | POA: Diagnosis not present

## 2019-07-20 DIAGNOSIS — M25559 Pain in unspecified hip: Secondary | ICD-10-CM | POA: Diagnosis not present

## 2019-07-20 DIAGNOSIS — R630 Anorexia: Secondary | ICD-10-CM | POA: Diagnosis not present

## 2019-07-20 DIAGNOSIS — R634 Abnormal weight loss: Secondary | ICD-10-CM | POA: Diagnosis not present

## 2019-07-20 DIAGNOSIS — S81002D Unspecified open wound, left knee, subsequent encounter: Secondary | ICD-10-CM | POA: Diagnosis not present

## 2019-07-20 DIAGNOSIS — M791 Myalgia, unspecified site: Secondary | ICD-10-CM | POA: Diagnosis not present

## 2019-07-20 DIAGNOSIS — Z79899 Other long term (current) drug therapy: Secondary | ICD-10-CM | POA: Diagnosis not present

## 2019-07-20 DIAGNOSIS — G3184 Mild cognitive impairment, so stated: Secondary | ICD-10-CM | POA: Diagnosis not present

## 2019-07-20 DIAGNOSIS — C4440 Unspecified malignant neoplasm of skin of scalp and neck: Secondary | ICD-10-CM | POA: Diagnosis not present

## 2019-07-20 DIAGNOSIS — M25569 Pain in unspecified knee: Secondary | ICD-10-CM | POA: Diagnosis not present

## 2019-07-20 DIAGNOSIS — R438 Other disturbances of smell and taste: Secondary | ICD-10-CM | POA: Diagnosis not present

## 2019-07-20 DIAGNOSIS — L4052 Psoriatic arthritis mutilans: Secondary | ICD-10-CM | POA: Diagnosis not present

## 2019-07-20 DIAGNOSIS — R300 Dysuria: Secondary | ICD-10-CM | POA: Diagnosis not present

## 2019-07-20 DIAGNOSIS — I10 Essential (primary) hypertension: Secondary | ICD-10-CM | POA: Diagnosis not present

## 2019-07-20 DIAGNOSIS — R4182 Altered mental status, unspecified: Secondary | ICD-10-CM | POA: Diagnosis not present

## 2019-07-20 DIAGNOSIS — R945 Abnormal results of liver function studies: Secondary | ICD-10-CM | POA: Diagnosis not present

## 2019-07-20 DIAGNOSIS — F0280 Dementia in other diseases classified elsewhere without behavioral disturbance: Secondary | ICD-10-CM | POA: Diagnosis not present

## 2019-07-20 DIAGNOSIS — R5383 Other fatigue: Secondary | ICD-10-CM | POA: Diagnosis not present

## 2019-09-18 DIAGNOSIS — Z23 Encounter for immunization: Secondary | ICD-10-CM | POA: Diagnosis not present

## 2019-09-24 DIAGNOSIS — Z23 Encounter for immunization: Secondary | ICD-10-CM | POA: Diagnosis not present

## 2019-10-13 DIAGNOSIS — M25551 Pain in right hip: Secondary | ICD-10-CM | POA: Diagnosis not present

## 2019-10-13 DIAGNOSIS — M150 Primary generalized (osteo)arthritis: Secondary | ICD-10-CM | POA: Diagnosis not present

## 2019-10-13 DIAGNOSIS — M5136 Other intervertebral disc degeneration, lumbar region: Secondary | ICD-10-CM | POA: Diagnosis not present

## 2019-10-13 DIAGNOSIS — L4050 Arthropathic psoriasis, unspecified: Secondary | ICD-10-CM | POA: Diagnosis not present

## 2019-10-13 DIAGNOSIS — M8580 Other specified disorders of bone density and structure, unspecified site: Secondary | ICD-10-CM | POA: Diagnosis not present

## 2019-10-13 DIAGNOSIS — L401 Generalized pustular psoriasis: Secondary | ICD-10-CM | POA: Diagnosis not present

## 2019-10-13 DIAGNOSIS — M25552 Pain in left hip: Secondary | ICD-10-CM | POA: Diagnosis not present

## 2019-10-13 DIAGNOSIS — Z6826 Body mass index (BMI) 26.0-26.9, adult: Secondary | ICD-10-CM | POA: Diagnosis not present

## 2019-10-13 DIAGNOSIS — M79675 Pain in left toe(s): Secondary | ICD-10-CM | POA: Diagnosis not present

## 2019-10-13 DIAGNOSIS — E663 Overweight: Secondary | ICD-10-CM | POA: Diagnosis not present

## 2019-10-20 DIAGNOSIS — M791 Myalgia, unspecified site: Secondary | ICD-10-CM | POA: Diagnosis not present

## 2019-10-20 DIAGNOSIS — S81002D Unspecified open wound, left knee, subsequent encounter: Secondary | ICD-10-CM | POA: Diagnosis not present

## 2019-10-20 DIAGNOSIS — M25559 Pain in unspecified hip: Secondary | ICD-10-CM | POA: Diagnosis not present

## 2019-10-20 DIAGNOSIS — R438 Other disturbances of smell and taste: Secondary | ICD-10-CM | POA: Diagnosis not present

## 2019-10-20 DIAGNOSIS — M25569 Pain in unspecified knee: Secondary | ICD-10-CM | POA: Diagnosis not present

## 2019-10-20 DIAGNOSIS — R630 Anorexia: Secondary | ICD-10-CM | POA: Diagnosis not present

## 2019-10-20 DIAGNOSIS — R5383 Other fatigue: Secondary | ICD-10-CM | POA: Diagnosis not present

## 2019-10-20 DIAGNOSIS — F0280 Dementia in other diseases classified elsewhere without behavioral disturbance: Secondary | ICD-10-CM | POA: Diagnosis not present

## 2019-10-20 DIAGNOSIS — R300 Dysuria: Secondary | ICD-10-CM | POA: Diagnosis not present

## 2019-10-20 DIAGNOSIS — R634 Abnormal weight loss: Secondary | ICD-10-CM | POA: Diagnosis not present

## 2019-10-20 DIAGNOSIS — R945 Abnormal results of liver function studies: Secondary | ICD-10-CM | POA: Diagnosis not present

## 2019-10-20 DIAGNOSIS — R4182 Altered mental status, unspecified: Secondary | ICD-10-CM | POA: Diagnosis not present

## 2019-10-27 DIAGNOSIS — L409 Psoriasis, unspecified: Secondary | ICD-10-CM | POA: Diagnosis not present

## 2019-10-27 DIAGNOSIS — R438 Other disturbances of smell and taste: Secondary | ICD-10-CM | POA: Diagnosis not present

## 2019-10-27 DIAGNOSIS — E782 Mixed hyperlipidemia: Secondary | ICD-10-CM | POA: Diagnosis not present

## 2019-10-27 DIAGNOSIS — Z0001 Encounter for general adult medical examination with abnormal findings: Secondary | ICD-10-CM | POA: Diagnosis not present

## 2019-10-27 DIAGNOSIS — G3184 Mild cognitive impairment, so stated: Secondary | ICD-10-CM | POA: Diagnosis not present

## 2019-10-27 DIAGNOSIS — J310 Chronic rhinitis: Secondary | ICD-10-CM | POA: Diagnosis not present

## 2019-10-27 DIAGNOSIS — M545 Low back pain, unspecified: Secondary | ICD-10-CM | POA: Diagnosis not present

## 2019-10-27 DIAGNOSIS — I10 Essential (primary) hypertension: Secondary | ICD-10-CM | POA: Diagnosis not present

## 2019-10-27 DIAGNOSIS — L4052 Psoriatic arthritis mutilans: Secondary | ICD-10-CM | POA: Diagnosis not present

## 2019-10-27 DIAGNOSIS — M791 Myalgia, unspecified site: Secondary | ICD-10-CM | POA: Diagnosis not present

## 2019-10-27 DIAGNOSIS — R945 Abnormal results of liver function studies: Secondary | ICD-10-CM | POA: Diagnosis not present

## 2020-01-11 DIAGNOSIS — M25551 Pain in right hip: Secondary | ICD-10-CM | POA: Diagnosis not present

## 2020-01-11 DIAGNOSIS — Z6826 Body mass index (BMI) 26.0-26.9, adult: Secondary | ICD-10-CM | POA: Diagnosis not present

## 2020-01-11 DIAGNOSIS — L4050 Arthropathic psoriasis, unspecified: Secondary | ICD-10-CM | POA: Diagnosis not present

## 2020-01-11 DIAGNOSIS — E663 Overweight: Secondary | ICD-10-CM | POA: Diagnosis not present

## 2020-01-11 DIAGNOSIS — M5136 Other intervertebral disc degeneration, lumbar region: Secondary | ICD-10-CM | POA: Diagnosis not present

## 2020-01-11 DIAGNOSIS — M25552 Pain in left hip: Secondary | ICD-10-CM | POA: Diagnosis not present

## 2020-01-11 DIAGNOSIS — M79675 Pain in left toe(s): Secondary | ICD-10-CM | POA: Diagnosis not present

## 2020-01-11 DIAGNOSIS — M150 Primary generalized (osteo)arthritis: Secondary | ICD-10-CM | POA: Diagnosis not present

## 2020-01-11 DIAGNOSIS — L401 Generalized pustular psoriasis: Secondary | ICD-10-CM | POA: Diagnosis not present

## 2020-01-11 DIAGNOSIS — M8580 Other specified disorders of bone density and structure, unspecified site: Secondary | ICD-10-CM | POA: Diagnosis not present

## 2020-01-29 DIAGNOSIS — L4050 Arthropathic psoriasis, unspecified: Secondary | ICD-10-CM | POA: Diagnosis not present

## 2020-02-26 DIAGNOSIS — L4050 Arthropathic psoriasis, unspecified: Secondary | ICD-10-CM | POA: Diagnosis not present

## 2020-03-18 DIAGNOSIS — M25552 Pain in left hip: Secondary | ICD-10-CM | POA: Diagnosis not present

## 2020-03-18 DIAGNOSIS — Z6825 Body mass index (BMI) 25.0-25.9, adult: Secondary | ICD-10-CM | POA: Diagnosis not present

## 2020-03-18 DIAGNOSIS — L4050 Arthropathic psoriasis, unspecified: Secondary | ICD-10-CM | POA: Diagnosis not present

## 2020-03-18 DIAGNOSIS — M25551 Pain in right hip: Secondary | ICD-10-CM | POA: Diagnosis not present

## 2020-03-18 DIAGNOSIS — M5136 Other intervertebral disc degeneration, lumbar region: Secondary | ICD-10-CM | POA: Diagnosis not present

## 2020-03-18 DIAGNOSIS — M8580 Other specified disorders of bone density and structure, unspecified site: Secondary | ICD-10-CM | POA: Diagnosis not present

## 2020-03-18 DIAGNOSIS — E663 Overweight: Secondary | ICD-10-CM | POA: Diagnosis not present

## 2020-03-18 DIAGNOSIS — L401 Generalized pustular psoriasis: Secondary | ICD-10-CM | POA: Diagnosis not present

## 2020-03-18 DIAGNOSIS — M79675 Pain in left toe(s): Secondary | ICD-10-CM | POA: Diagnosis not present

## 2020-03-18 DIAGNOSIS — M150 Primary generalized (osteo)arthritis: Secondary | ICD-10-CM | POA: Diagnosis not present

## 2020-03-31 DIAGNOSIS — Z23 Encounter for immunization: Secondary | ICD-10-CM | POA: Diagnosis not present

## 2020-04-19 DIAGNOSIS — I10 Essential (primary) hypertension: Secondary | ICD-10-CM | POA: Diagnosis not present

## 2020-04-19 DIAGNOSIS — R945 Abnormal results of liver function studies: Secondary | ICD-10-CM | POA: Diagnosis not present

## 2020-04-19 DIAGNOSIS — L570 Actinic keratosis: Secondary | ICD-10-CM | POA: Diagnosis not present

## 2020-04-19 DIAGNOSIS — Z6826 Body mass index (BMI) 26.0-26.9, adult: Secondary | ICD-10-CM | POA: Diagnosis not present

## 2020-04-19 DIAGNOSIS — R634 Abnormal weight loss: Secondary | ICD-10-CM | POA: Diagnosis not present

## 2020-04-19 DIAGNOSIS — Z6825 Body mass index (BMI) 25.0-25.9, adult: Secondary | ICD-10-CM | POA: Diagnosis not present

## 2020-04-19 DIAGNOSIS — R630 Anorexia: Secondary | ICD-10-CM | POA: Diagnosis not present

## 2020-04-19 DIAGNOSIS — Z79899 Other long term (current) drug therapy: Secondary | ICD-10-CM | POA: Diagnosis not present

## 2020-04-19 DIAGNOSIS — Z712 Person consulting for explanation of examination or test findings: Secondary | ICD-10-CM | POA: Diagnosis not present

## 2020-04-26 DIAGNOSIS — E782 Mixed hyperlipidemia: Secondary | ICD-10-CM | POA: Diagnosis not present

## 2020-04-26 DIAGNOSIS — L4052 Psoriatic arthritis mutilans: Secondary | ICD-10-CM | POA: Diagnosis not present

## 2020-04-26 DIAGNOSIS — L409 Psoriasis, unspecified: Secondary | ICD-10-CM | POA: Diagnosis not present

## 2020-04-26 DIAGNOSIS — M791 Myalgia, unspecified site: Secondary | ICD-10-CM | POA: Diagnosis not present

## 2020-04-26 DIAGNOSIS — J310 Chronic rhinitis: Secondary | ICD-10-CM | POA: Diagnosis not present

## 2020-04-26 DIAGNOSIS — M545 Low back pain, unspecified: Secondary | ICD-10-CM | POA: Diagnosis not present

## 2020-04-26 DIAGNOSIS — R438 Other disturbances of smell and taste: Secondary | ICD-10-CM | POA: Diagnosis not present

## 2020-04-26 DIAGNOSIS — G3184 Mild cognitive impairment, so stated: Secondary | ICD-10-CM | POA: Diagnosis not present

## 2020-04-26 DIAGNOSIS — I10 Essential (primary) hypertension: Secondary | ICD-10-CM | POA: Diagnosis not present

## 2020-04-26 DIAGNOSIS — R945 Abnormal results of liver function studies: Secondary | ICD-10-CM | POA: Diagnosis not present

## 2020-04-29 DIAGNOSIS — Z79899 Other long term (current) drug therapy: Secondary | ICD-10-CM | POA: Diagnosis not present

## 2020-04-29 DIAGNOSIS — L4050 Arthropathic psoriasis, unspecified: Secondary | ICD-10-CM | POA: Diagnosis not present

## 2020-06-24 DIAGNOSIS — M8580 Other specified disorders of bone density and structure, unspecified site: Secondary | ICD-10-CM | POA: Diagnosis not present

## 2020-06-24 DIAGNOSIS — M5136 Other intervertebral disc degeneration, lumbar region: Secondary | ICD-10-CM | POA: Diagnosis not present

## 2020-06-24 DIAGNOSIS — M25552 Pain in left hip: Secondary | ICD-10-CM | POA: Diagnosis not present

## 2020-06-24 DIAGNOSIS — Z6824 Body mass index (BMI) 24.0-24.9, adult: Secondary | ICD-10-CM | POA: Diagnosis not present

## 2020-06-24 DIAGNOSIS — L4050 Arthropathic psoriasis, unspecified: Secondary | ICD-10-CM | POA: Diagnosis not present

## 2020-06-24 DIAGNOSIS — L401 Generalized pustular psoriasis: Secondary | ICD-10-CM | POA: Diagnosis not present

## 2020-06-24 DIAGNOSIS — M150 Primary generalized (osteo)arthritis: Secondary | ICD-10-CM | POA: Diagnosis not present

## 2020-06-24 DIAGNOSIS — M79675 Pain in left toe(s): Secondary | ICD-10-CM | POA: Diagnosis not present

## 2020-06-24 DIAGNOSIS — M25551 Pain in right hip: Secondary | ICD-10-CM | POA: Diagnosis not present

## 2020-06-27 DIAGNOSIS — Z79899 Other long term (current) drug therapy: Secondary | ICD-10-CM | POA: Diagnosis not present

## 2020-06-27 DIAGNOSIS — L4050 Arthropathic psoriasis, unspecified: Secondary | ICD-10-CM | POA: Diagnosis not present

## 2020-06-28 IMAGING — CT CT HEAD W/O CM
3 series · 16 of 47 positions shown, 19 images · non-contrast
Comparison: 05/12/2009 CT head

CLINICAL DATA: 71 y/o M; unexplained altered level of
consciousness.

EXAM:
CT HEAD WITHOUT CONTRAST
TECHNIQUE: Contiguous axial images were obtained from the base of the skull
through the vertex without intravenous contrast.

[Series 2: head trauma wo · axial · 0.42mm/px · z∈[+1573,+1698]mm · 10 of 31 slices shown, 13 images]
[im 3/31  brain]
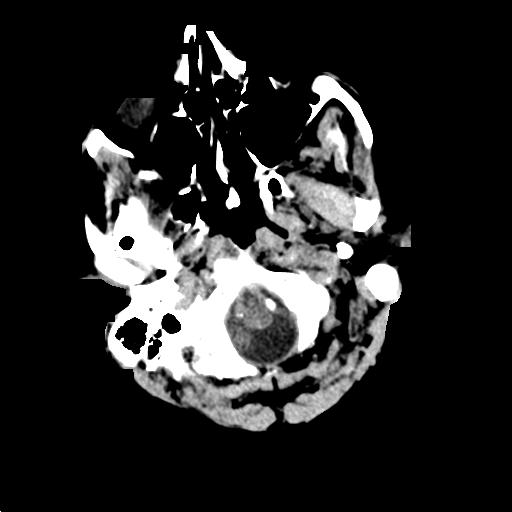
[im 3/31  bone]
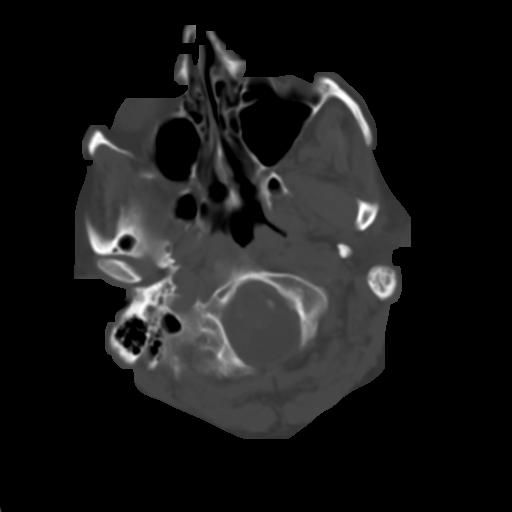
[im 6/31  brain]
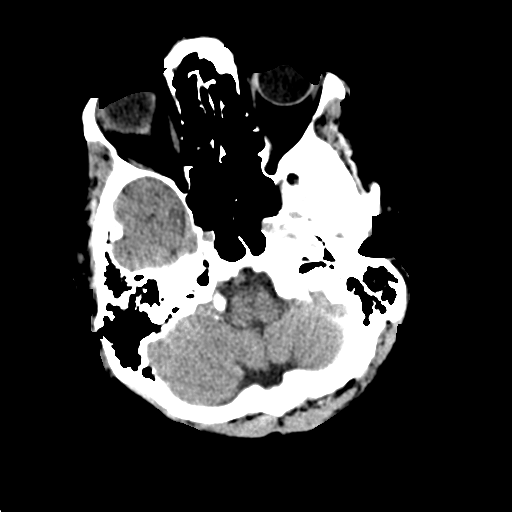
[im 9/31  brain]
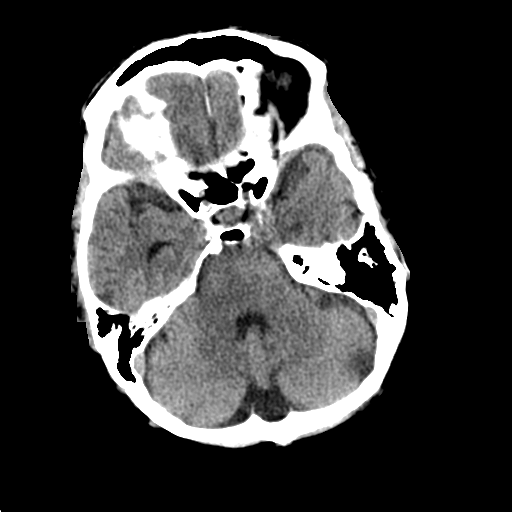
[im 11/31  brain]
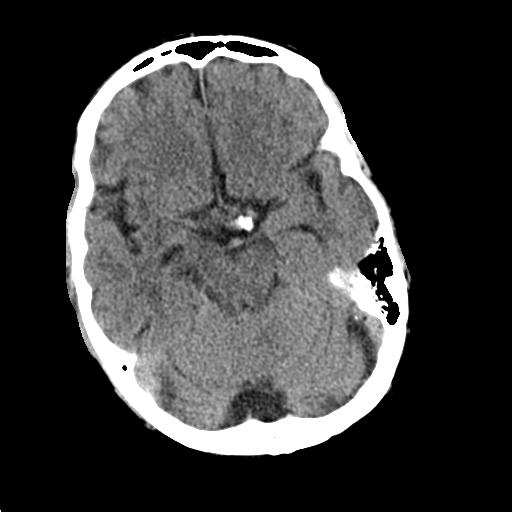
[im 14/31  brain]
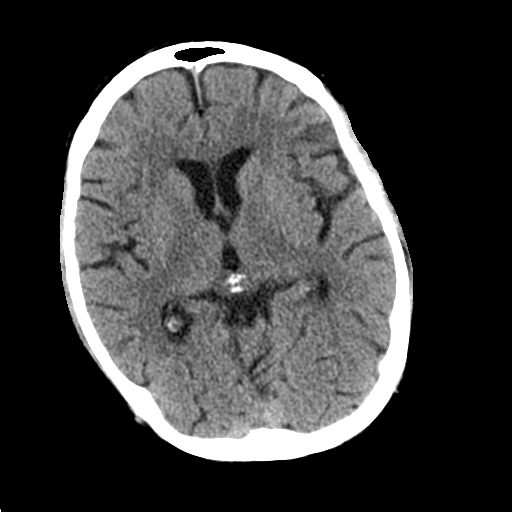
[im 14/31  bone]
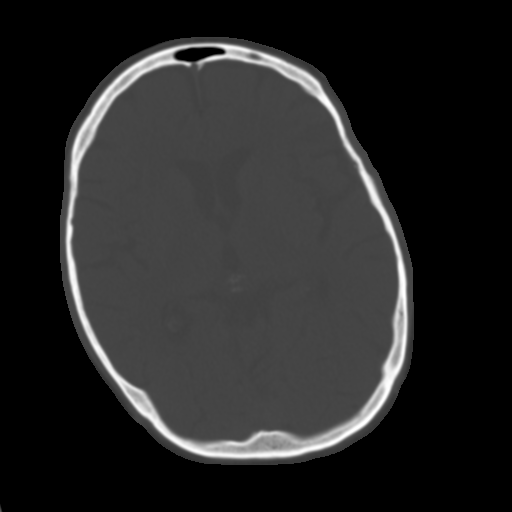
[im 17/31  brain]
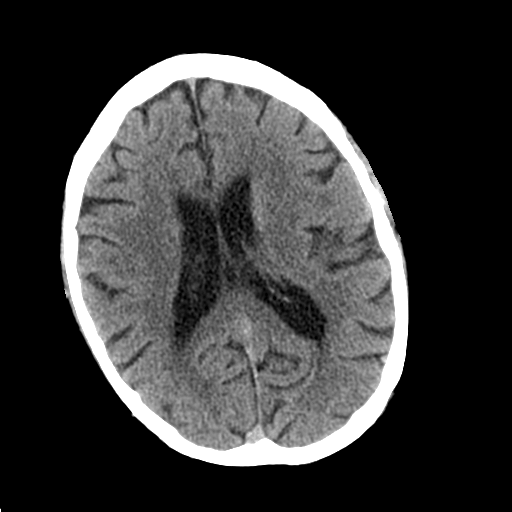
[im 20/31  brain]
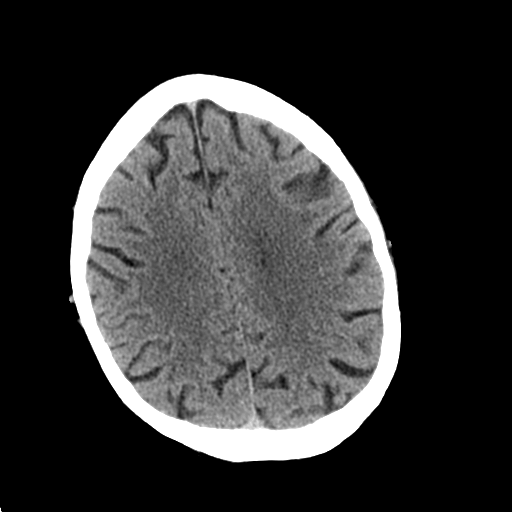
[im 23/31  brain]
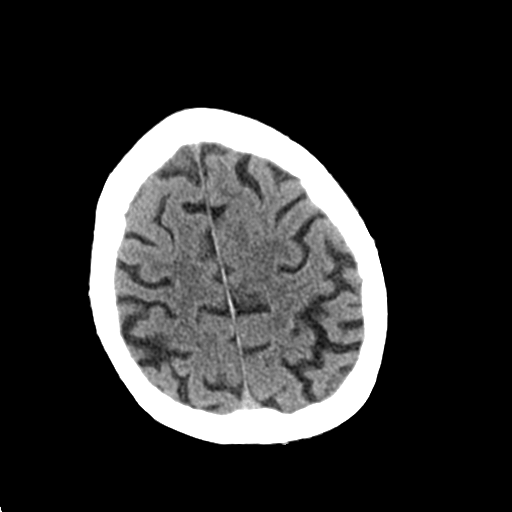
[im 25/31  brain]
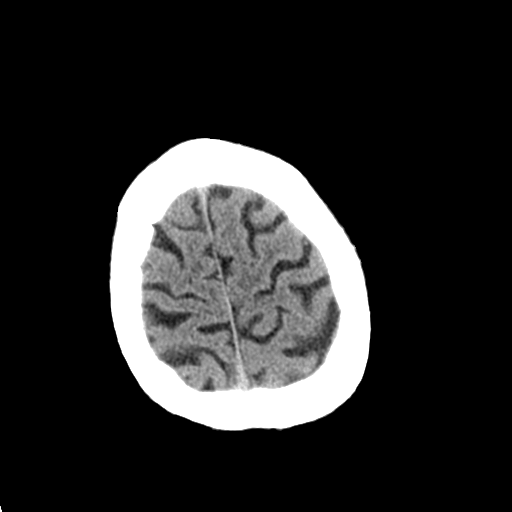
[im 25/31  bone]
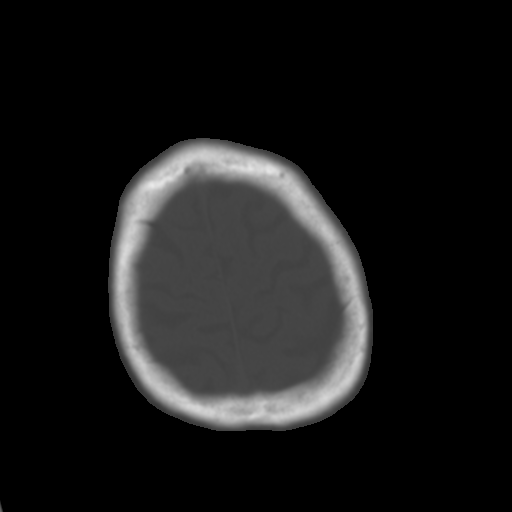
[im 28/31  brain]
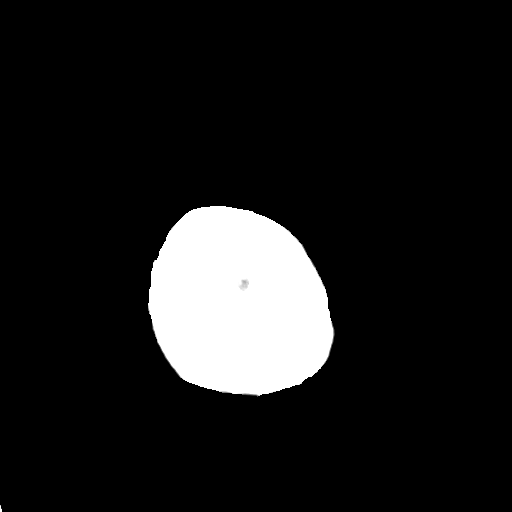

[Series 4: coronal soft tissue · coronal · 0.31mm/px · 3 of 66 slices shown]
[im 22/66  brain]
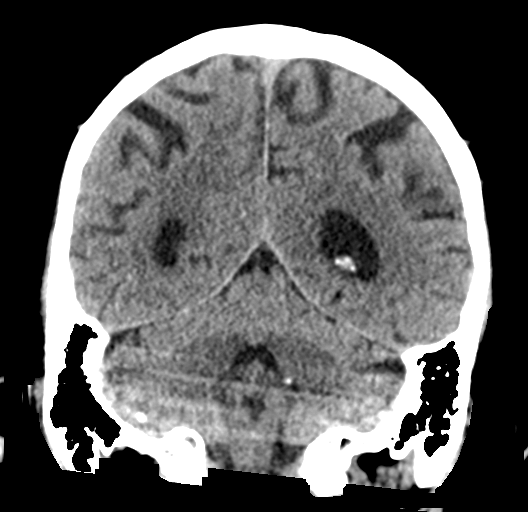
[im 29/66  brain]
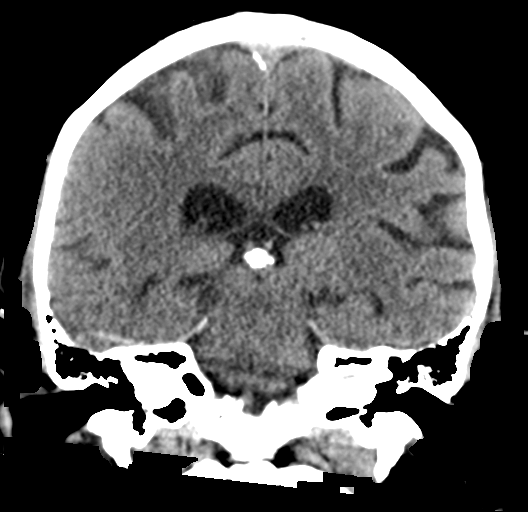
[im 37/66  brain]
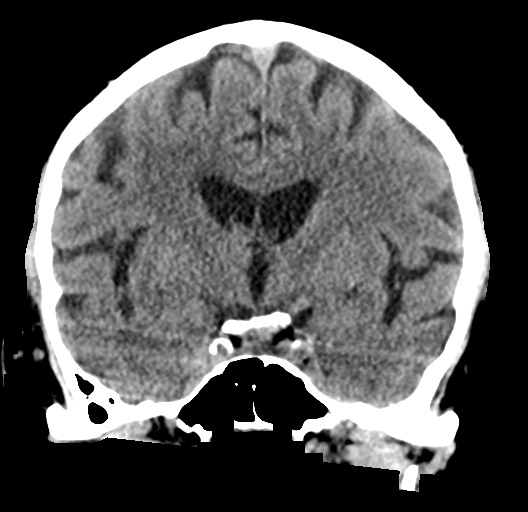

[Series 5: sagittal soft tissue · sagittal · 0.31mm/px · 3 of 57 slices shown]
[im 21/57  brain]
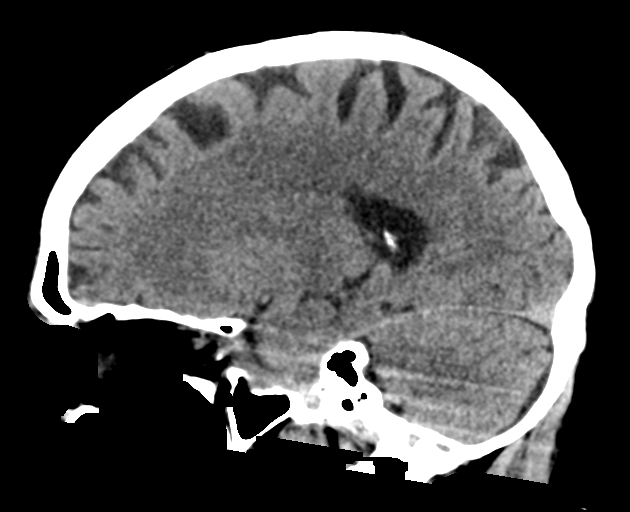
[im 29/57  brain]
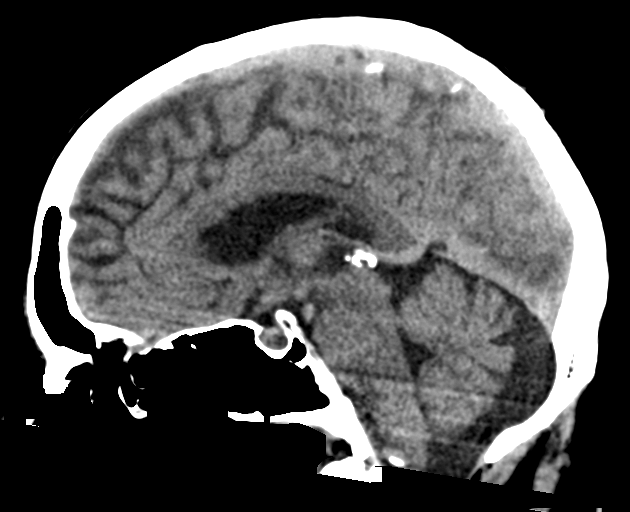
[im 36/57  brain]
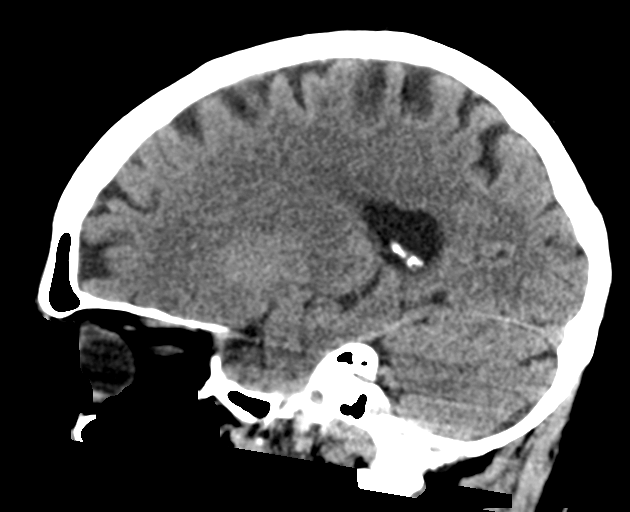

[16 of 47 positions shown; findings below may reference images not displayed]

FINDINGS: Brain: No evidence of acute infarction, hemorrhage, hydrocephalus,
extra-axial collection or mass lesion/mass effect. Progression of
nonspecific white matter hypodensities chronic microvascular
ischemic changes. Progression of brain volume loss

Vascular: Calcific atherosclerosis of carotid siphons. Hyperdense
vessel identified

Skull: Normal. Negative for fracture or focal lesion.

Sinuses/Orbits: No acute finding.

Other: None.
IMPRESSION: 1. No acute intracranial abnormality identified.
2. Progression of chronic microvascular ischemic changes and volume
loss of the brain from 8766.

## 2020-06-28 IMAGING — DX DG CHEST 2V
2 series · 2 of 2 positions shown · non-contrast
Comparison: 08/17/2015

CLINICAL DATA: Dizziness and shortness of breath

EXAM:
CHEST - 2 VIEW

[chest pa]
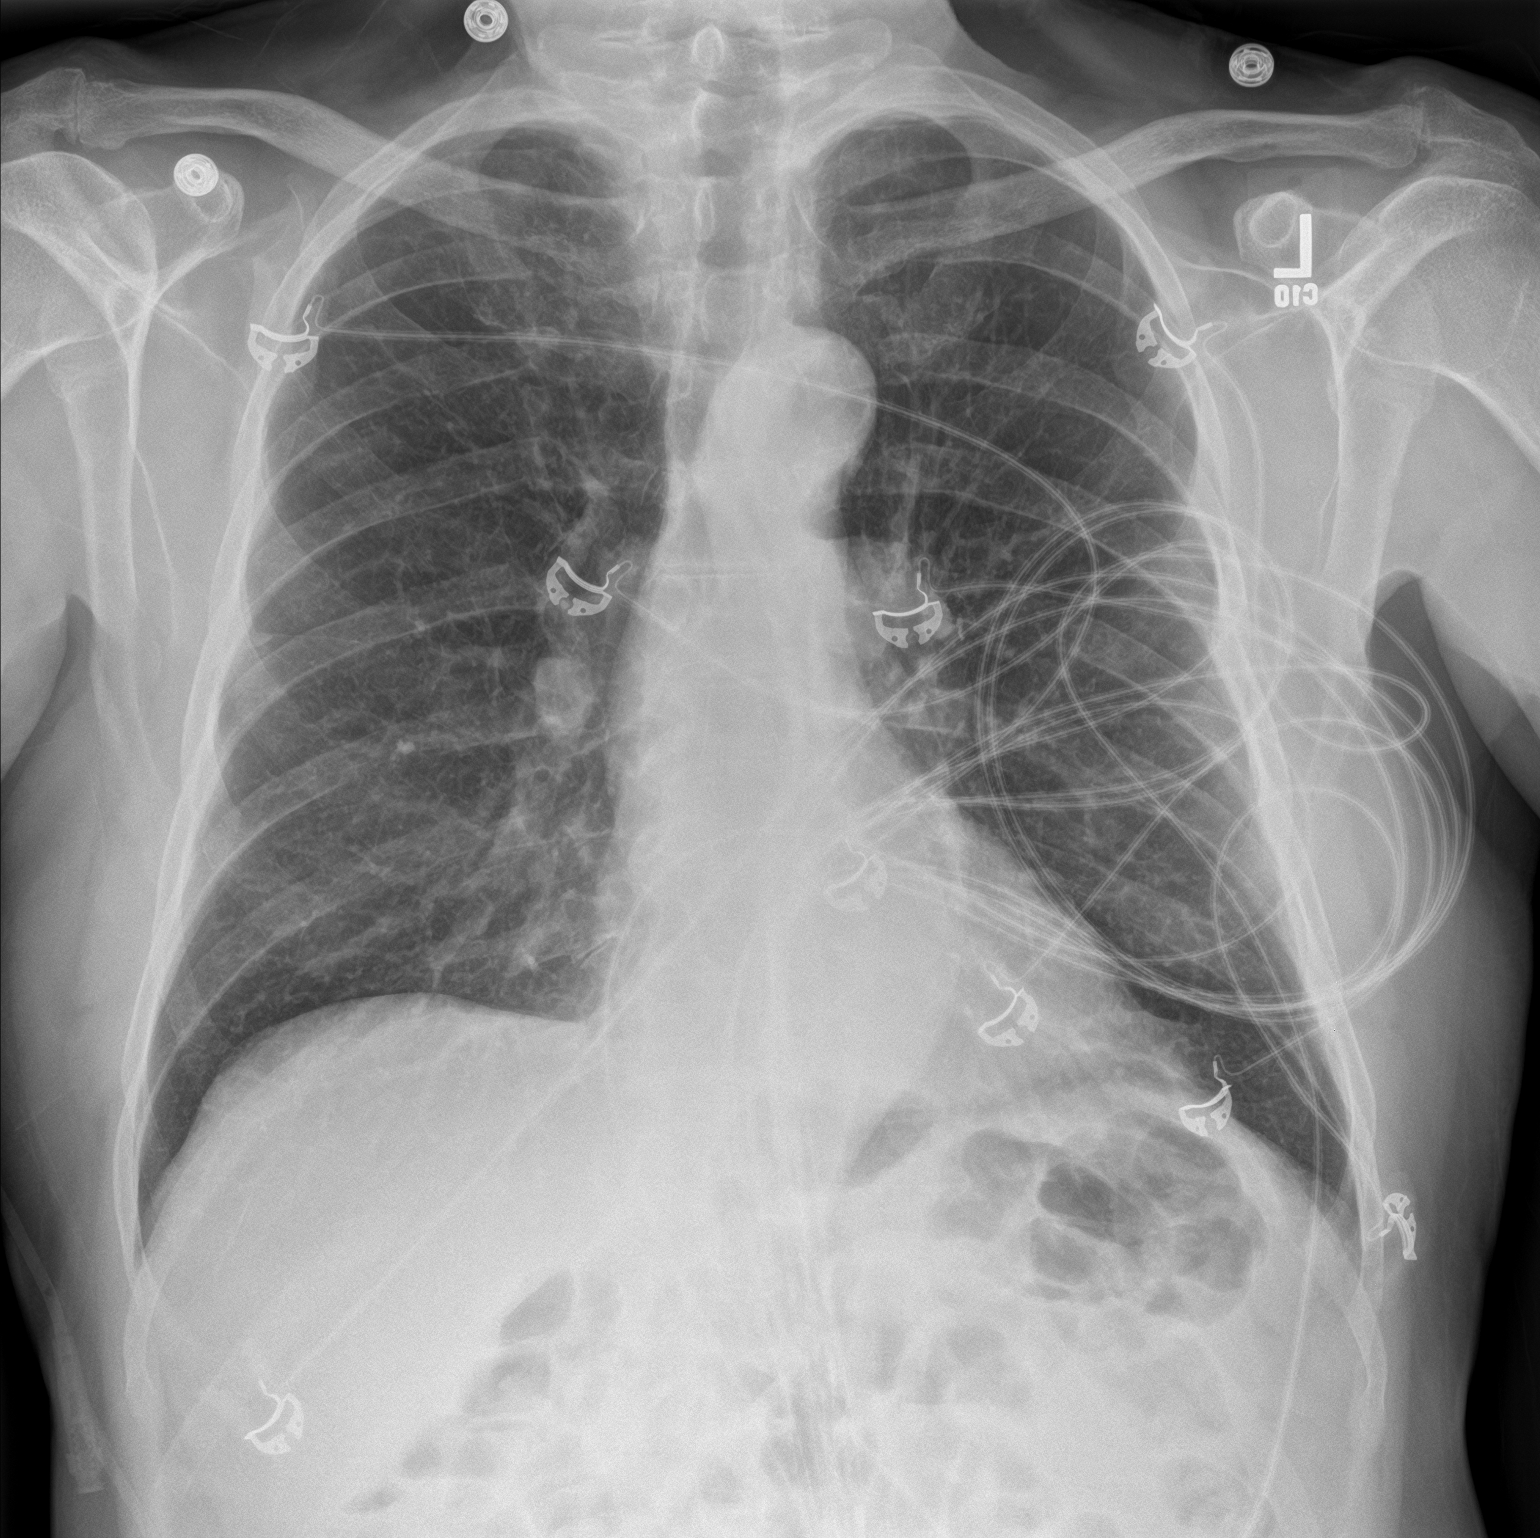

[chest lat]
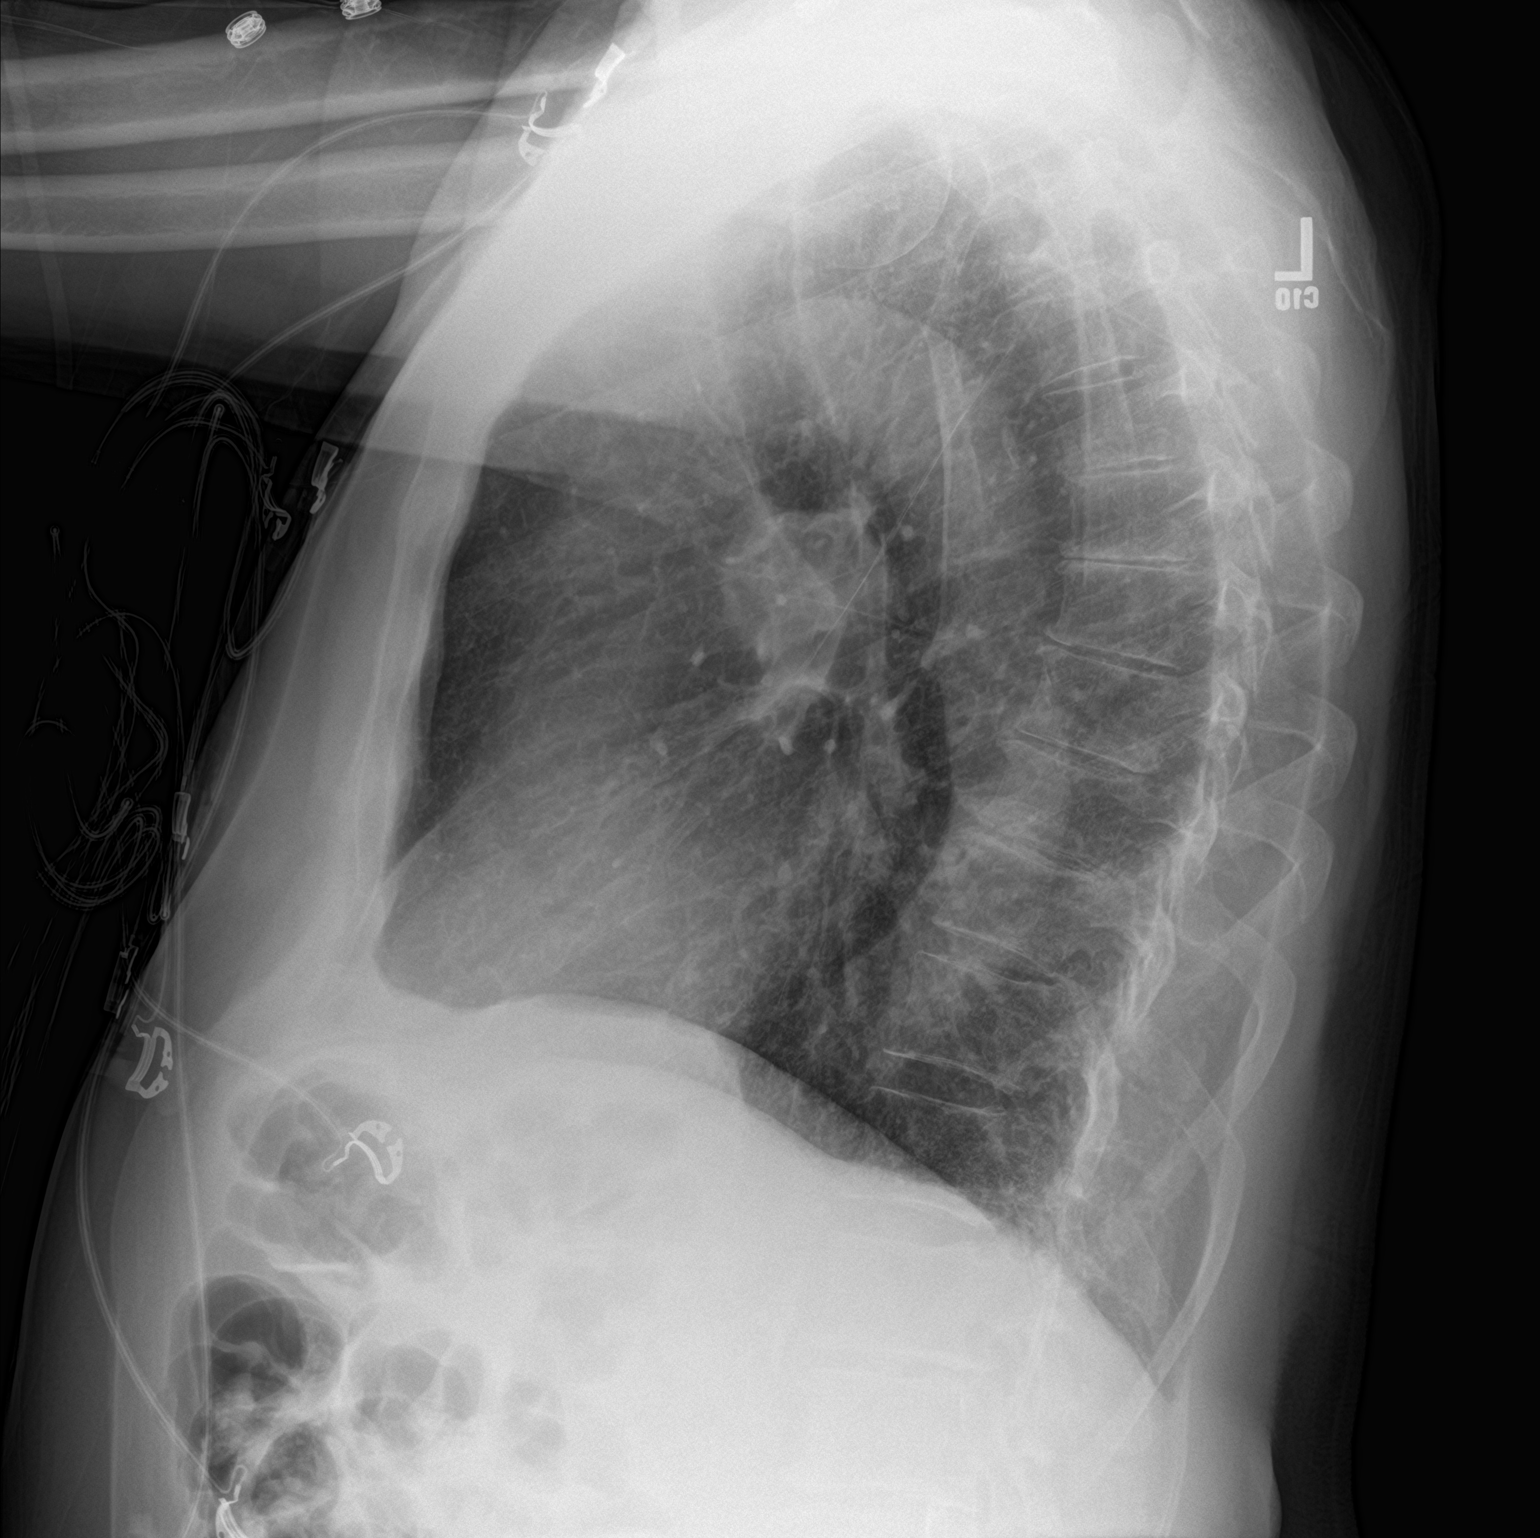

[2 of 2 positions shown; findings below may reference images not displayed]

FINDINGS: Cardiac shadows within normal limits. Aortic calcifications are
again seen and stable. The lungs are well aerated bilaterally. No
focal infiltrate or effusion is seen. Mild degenerative changes of
the thoracic spine are noted.
IMPRESSION: No acute abnormality noted.

## 2020-08-01 DIAGNOSIS — H25813 Combined forms of age-related cataract, bilateral: Secondary | ICD-10-CM | POA: Diagnosis not present

## 2020-08-22 DIAGNOSIS — L4050 Arthropathic psoriasis, unspecified: Secondary | ICD-10-CM | POA: Diagnosis not present

## 2020-09-16 ENCOUNTER — Encounter: Payer: Self-pay | Admitting: Emergency Medicine

## 2020-09-16 ENCOUNTER — Ambulatory Visit
Admission: EM | Admit: 2020-09-16 | Discharge: 2020-09-16 | Disposition: A | Discharge status: Home / Self Care | Payer: Medicare Other | Attending: Emergency Medicine | Admitting: Emergency Medicine

## 2020-09-16 DIAGNOSIS — U071 COVID-19: Secondary | ICD-10-CM | POA: Diagnosis not present

## 2020-09-16 MED ORDER — ONDANSETRON HCL 4 MG PO TABS: 4.0000 mg | ORAL_TABLET | Freq: Four times a day (QID) | ORAL | 0 refills | Status: DC

## 2020-09-16 MED ORDER — NIRMATRELVIR/RITONAVIR (PAXLOVID)TABLET: 3.0000 | ORAL_TABLET | Freq: Two times a day (BID) | ORAL | 0 refills | Status: AC

## 2020-09-16 NOTE — Discharge Instructions (Addendum)
Get plenty of rest and push fluids Paxlovid prescribed Zofran prescribed as needed for nausea Use OTC medications like ibuprofen or tylenol as needed fever or pain Follow up with PCP for rechech Call or go to the ED if you have any new or worsening symptoms such as fever, cough, shortness of breath, chest tightness, chest pain, turning blue, changes in mental status, etc...   Plasma concentrations and pharmacologic effects of simvastatin may be increased by nirmatrelvir/ritonavir. Coadministration is contraindicated. Simvastatin should be discontinued for at least 12 hours prior to starting nirmatrelvir/ritonavir, during the 5 days of therapy with nirmatrelvir/ritonavir and for an additional 5 days after nirmatrelvir/ritonavir therapy has been completed.

## 2020-09-16 NOTE — ED Triage Notes (Signed)
Pt presents today with c/o nasal congestion, cough with body aches/chills since Wednesday. He tested positive for Covid at home. Seeking antiviral medications.

## 2020-09-16 NOTE — ED Provider Notes (Signed)
[place]   [id number] 09/16/20 Arrival Time: [id number]   CC: COVID symptoms  SUBJECTIVE: History from: patient.  Douglas Reynolds is a 75 y.o. male who presents with nausea, body aches, fatigue x 2 days.  Reports positive exposure to covid.  Reports positive covid test at home.  Denies alleviating or aggravating factors.  Denies previous symptoms in the past.   Denies fever, sinus pain, rhinorrhea, sore throat, SOB, wheezing, chest pain, changes in bowel or bladder habits.    ROS: As per HPI.  All other pertinent ROS negative.     Past Medical History:  Diagnosis Date   Dementia ([place])    Hyperlipidemia 03/27/2011   Hypertension 03/27/2011   Psoriatic arthritis ([place]) 03/27/2011   History reviewed. No pertinent surgical history. No Known Allergies No current facility-administered medications on file prior to encounter.   Current Outpatient Medications on File Prior to Encounter  Medication Sig Dispense Refill   Calcium Carb-Cholecalciferol 600-800 MG-UNIT TABS Take 1 tablet by mouth daily.     Calcium Carbonate (CALCIUM 600 PO) Take by mouth.     folic acid (FOLVITE) 1 MG tablet Take 1 tablet by mouth daily.     lisinopril-hydrochlorothiazide (PRINZIDE,ZESTORETIC) 20-25 MG per tablet Take 1 tablet by mouth daily.     meclizine (ANTIVERT) 12.5 MG tablet Take 1 tablet (12.5 mg total) by mouth 3 (three) times daily as needed for dizziness. 30 tablet 0   Memantine HCl-Donepezil HCl (NAMZARIC PO) Take 1 tablet by mouth daily.     Multiple Vitamins-Minerals (CENTRUM PO) Take by mouth.     omega-3 fish oil (MAXEPA) 1000 MG CAPS capsule Take by mouth.     predniSONE (DELTASONE) 2.5 MG tablet Take 2.5 mg by mouth daily.     simvastatin (ZOCOR) 40 MG tablet Take 40 mg by mouth every evening.     Social History   Socioeconomic History   Marital status: Married    Spouse name: Not on file   Number of children: Not on file   Years of education: Not on file   Highest  education level: Not on file  Occupational History   Not on file  Tobacco Use   Smoking status: Never   Smokeless tobacco: Never  Vaping Use   Vaping Use: Never used  Substance and Sexual Activity   Alcohol use: Never   Drug use: Never   Sexual activity: Never  Other Topics Concern   Not on file  Social History Narrative   Not on file   Social Determinants of Health   Financial Resource Strain: Not on file  Food Insecurity: Not on file  Transportation Needs: Not on file  Physical Activity: Not on file  Stress: Not on file  Social Connections: Not on file  Intimate Partner Violence: Not on file   History reviewed. No pertinent family history.  OBJECTIVE:  Vitals:   09/16/20 1115  BP: (!) 150/65  Pulse: 81  Resp: 20  Temp: 98.3 F (36.8 C)  TempSrc: Oral  SpO2: 94%     General appearance: alert; appears fatigued, but nontoxic; speaking in full sentences and tolerating own secretions HEENT: NCAT; Ears: EACs clear, TMs pearly gray; Eyes: PERRL.  EOM grossly intact. Nose: nares patent without rhinorrhea, Throat: oropharynx clear, tonsils non erythematous or enlarged, uvula midline  Neck: supple without LAD Lungs: unlabored respirations, symmetrical air entry; cough: absent; no respiratory distress; CTAB Heart: regular rate and rhythm.   Abdomen: soft, nondistended, normal active bowel sounds; nontender  to palpation; no guarding  Skin: warm and dry Psychological: alert and cooperative; normal mood and affect  ASSESSMENT & PLAN:  1. COVID-19 virus infection     Meds ordered this encounter  Medications   nirmatrelvir/ritonavir EUA (PAXLOVID) 20 x 150 MG & 10 x '100MG'$  TABS    Sig: Take 3 tablets by mouth 2 (two) times daily for 5 days. Patient GFR is 88. Take nirmatrelvir (150 mg) two tablets twice daily for 5 days and ritonavir (100 mg) one tablet twice daily for 5 days.    Dispense:  30 tablet    Refill:  0    Order Specific Question:   Supervising Provider     Answer:   Raylene Everts [id number]    Get plenty of rest and push fluids Paxlovid prescribed Zofran prescribed as needed for nausea Use OTC medications like ibuprofen or tylenol as needed fever or pain Follow up with PCP for rechech Call or go to the ED if you have any new or worsening symptoms such as fever, cough, shortness of breath, chest tightness, chest pain, turning blue, changes in mental status, etc...   Reviewed expectations re: course of current medical issues. Questions answered. Outlined signs and symptoms indicating need for more acute intervention. Patient verbalized understanding. After Visit Summary given.          Lestine Box, PA-C 09/16/20 1155

## 2020-10-03 DIAGNOSIS — L401 Generalized pustular psoriasis: Secondary | ICD-10-CM | POA: Diagnosis not present

## 2020-10-03 DIAGNOSIS — M79675 Pain in left toe(s): Secondary | ICD-10-CM | POA: Diagnosis not present

## 2020-10-03 DIAGNOSIS — L4050 Arthropathic psoriasis, unspecified: Secondary | ICD-10-CM | POA: Diagnosis not present

## 2020-10-03 DIAGNOSIS — M150 Primary generalized (osteo)arthritis: Secondary | ICD-10-CM | POA: Diagnosis not present

## 2020-10-03 DIAGNOSIS — M5136 Other intervertebral disc degeneration, lumbar region: Secondary | ICD-10-CM | POA: Diagnosis not present

## 2020-10-03 DIAGNOSIS — M25552 Pain in left hip: Secondary | ICD-10-CM | POA: Diagnosis not present

## 2020-10-03 DIAGNOSIS — Z6823 Body mass index (BMI) 23.0-23.9, adult: Secondary | ICD-10-CM | POA: Diagnosis not present

## 2020-10-03 DIAGNOSIS — M25551 Pain in right hip: Secondary | ICD-10-CM | POA: Diagnosis not present

## 2020-10-03 DIAGNOSIS — M8580 Other specified disorders of bone density and structure, unspecified site: Secondary | ICD-10-CM | POA: Diagnosis not present

## 2020-10-17 DIAGNOSIS — L4050 Arthropathic psoriasis, unspecified: Secondary | ICD-10-CM | POA: Diagnosis not present

## 2020-10-17 DIAGNOSIS — Z79899 Other long term (current) drug therapy: Secondary | ICD-10-CM | POA: Diagnosis not present

## 2020-10-27 DIAGNOSIS — I10 Essential (primary) hypertension: Secondary | ICD-10-CM | POA: Diagnosis not present

## 2020-10-27 DIAGNOSIS — R7301 Impaired fasting glucose: Secondary | ICD-10-CM | POA: Diagnosis not present

## 2020-10-31 DIAGNOSIS — I10 Essential (primary) hypertension: Secondary | ICD-10-CM | POA: Diagnosis not present

## 2020-10-31 DIAGNOSIS — M791 Myalgia, unspecified site: Secondary | ICD-10-CM | POA: Diagnosis not present

## 2020-10-31 DIAGNOSIS — J310 Chronic rhinitis: Secondary | ICD-10-CM | POA: Diagnosis not present

## 2020-10-31 DIAGNOSIS — G3184 Mild cognitive impairment, so stated: Secondary | ICD-10-CM | POA: Diagnosis not present

## 2020-10-31 DIAGNOSIS — M545 Low back pain, unspecified: Secondary | ICD-10-CM | POA: Diagnosis not present

## 2020-10-31 DIAGNOSIS — R634 Abnormal weight loss: Secondary | ICD-10-CM | POA: Diagnosis not present

## 2020-10-31 DIAGNOSIS — R7303 Prediabetes: Secondary | ICD-10-CM | POA: Diagnosis not present

## 2020-10-31 DIAGNOSIS — M25552 Pain in left hip: Secondary | ICD-10-CM | POA: Diagnosis not present

## 2020-10-31 DIAGNOSIS — E782 Mixed hyperlipidemia: Secondary | ICD-10-CM | POA: Diagnosis not present

## 2020-10-31 DIAGNOSIS — R945 Abnormal results of liver function studies: Secondary | ICD-10-CM | POA: Diagnosis not present

## 2020-10-31 DIAGNOSIS — L4050 Arthropathic psoriasis, unspecified: Secondary | ICD-10-CM | POA: Diagnosis not present

## 2020-10-31 DIAGNOSIS — Z23 Encounter for immunization: Secondary | ICD-10-CM | POA: Diagnosis not present

## 2020-12-12 DIAGNOSIS — L4050 Arthropathic psoriasis, unspecified: Secondary | ICD-10-CM | POA: Diagnosis not present

## 2020-12-12 DIAGNOSIS — Z79899 Other long term (current) drug therapy: Secondary | ICD-10-CM | POA: Diagnosis not present

## 2021-01-14 DIAGNOSIS — Z23 Encounter for immunization: Secondary | ICD-10-CM | POA: Diagnosis not present

## 2021-01-23 DIAGNOSIS — L4050 Arthropathic psoriasis, unspecified: Secondary | ICD-10-CM | POA: Diagnosis not present

## 2021-01-23 DIAGNOSIS — M25551 Pain in right hip: Secondary | ICD-10-CM | POA: Diagnosis not present

## 2021-01-23 DIAGNOSIS — L401 Generalized pustular psoriasis: Secondary | ICD-10-CM | POA: Diagnosis not present

## 2021-01-23 DIAGNOSIS — Z6824 Body mass index (BMI) 24.0-24.9, adult: Secondary | ICD-10-CM | POA: Diagnosis not present

## 2021-01-23 DIAGNOSIS — M25552 Pain in left hip: Secondary | ICD-10-CM | POA: Diagnosis not present

## 2021-01-23 DIAGNOSIS — M5136 Other intervertebral disc degeneration, lumbar region: Secondary | ICD-10-CM | POA: Diagnosis not present

## 2021-01-23 DIAGNOSIS — M79675 Pain in left toe(s): Secondary | ICD-10-CM | POA: Diagnosis not present

## 2021-01-23 DIAGNOSIS — M8580 Other specified disorders of bone density and structure, unspecified site: Secondary | ICD-10-CM | POA: Diagnosis not present

## 2021-01-23 DIAGNOSIS — M150 Primary generalized (osteo)arthritis: Secondary | ICD-10-CM | POA: Diagnosis not present

## 2021-02-06 DIAGNOSIS — Z79899 Other long term (current) drug therapy: Secondary | ICD-10-CM | POA: Diagnosis not present

## 2021-02-06 DIAGNOSIS — Z111 Encounter for screening for respiratory tuberculosis: Secondary | ICD-10-CM | POA: Diagnosis not present

## 2021-02-06 DIAGNOSIS — R5383 Other fatigue: Secondary | ICD-10-CM | POA: Diagnosis not present

## 2021-02-06 DIAGNOSIS — L4050 Arthropathic psoriasis, unspecified: Secondary | ICD-10-CM | POA: Diagnosis not present

## 2021-02-13 ENCOUNTER — Other Ambulatory Visit: Payer: Self-pay

## 2021-02-13 ENCOUNTER — Ambulatory Visit (HOSPITAL_COMMUNITY): Payer: Medicare Other | Attending: Internal Medicine | Admitting: Physical Therapy

## 2021-02-13 ENCOUNTER — Encounter (HOSPITAL_COMMUNITY): Payer: Self-pay | Admitting: Physical Therapy

## 2021-02-13 DIAGNOSIS — R296 Repeated falls: Secondary | ICD-10-CM | POA: Diagnosis not present

## 2021-02-13 NOTE — Therapy (Signed)
[facility] [place], [place], [address] Phone: [phone]   Fax:  [phone]  Physical Therapy Evaluation  Patient Details  Name: Douglas Reynolds MRN: [id number] Date of Birth: 08-Feb-1945 Referring Provider (PT): Douglas Reynolds   Encounter Date: 02/13/2021   PT End of Session - 02/13/21 1134     Visit Number 1    Number of Visits 4    Date for PT Re-Evaluation 03/15/21    Authorization Type medicare/BCBS    Progress Note Due on Visit 4    PT Start Time 1050    PT Stop Time 1130    PT Time Calculation (min) 40 min    Equipment Utilized During Treatment Gait belt    Activity Tolerance Patient tolerated treatment well    Behavior During Therapy Douglas Reynolds for tasks assessed/performed             Past Medical History:  Diagnosis Date   Dementia ([place])    Hyperlipidemia 03/27/2011   Hypertension 03/27/2011   Psoriatic arthritis ([place]) 03/27/2011    History reviewed. No pertinent surgical history.  There were no vitals filed for this visit.    Subjective Assessment - 02/13/21 1053     Subjective Douglas Reynolds states that he loses his balance just about daily.  He does not usually fall as there is usually a wall or an object to hold onto.  His wife states that if anything happens that affects his forward motion when he is walking he will lose his balance.  He is concerned that he can not put his pants on when he is standing.    Limitations Walking;Standing    How long can you sit comfortably? no problem    How long can you stand comfortably? no problem    How long can you walk comfortably? Walks with a cane as far as he wants to go with the cane.    Patient Stated Goals Not to lose his balance as easily as he does.    Currently in Pain? No/denies                [facility] PT Assessment - 02/13/21 0001       Assessment   Medical Diagnosis decreased balance    Referring Provider (PT) Douglas Reynolds     Onset Date/Surgical Date 05/15/20   chronic   Next MD Visit not scheduled    Prior Therapy 10/2020      Precautions   Precautions Fall      Restrictions   Weight Bearing Restrictions No      Balance Screen   Has the patient fallen in the past 6 months Yes    How many times? daily    Has the patient had a decrease in activity level because of a fear of falling?  Yes    Is the patient reluctant to leave their home because of a fear of falling?  No      Home Ecologist residence    Home Access Stairs to enter    Entrance Stairs-Number of Steps 1    [place] Two level    Alternate Level Stairs-Number of Steps 12      Prior Function   Level of Independence Independent      Cognition   Overall Cognitive Status Within Functional Limits for tasks assessed   pt has been dx with dementia     Functional Tests   Functional tests  Single leg stance;Sit to Stand      Sit to Stand   Comments 12 in 30 second      ROM / Strength   AROM / PROM / Strength Strength      Strength   Strength Assessment Site Hip;Knee;Ankle   All wnl   Right/Left Hip Right;Left    Right/Left Knee Right;Left    Right/Left Ankle Right;Left      Standardized Balance Assessment   Standardized Balance Assessment Berg Balance Test;Dynamic Gait Index      Berg Balance Test   Sit to Stand Able to stand without using hands and stabilize independently    Standing Unsupported Able to stand safely 2 minutes    Sitting with Back Unsupported but Feet Supported on Floor or Stool Able to sit safely and securely 2 minutes    Stand to Sit Sits safely with minimal use of hands    Transfers Able to transfer safely, minor use of hands    Standing Unsupported with Eyes Closed Able to stand 10 seconds safely    Standing Unsupported with Feet Together Able to place feet together independently and stand 1 minute safely    From Standing, Reach Forward with Outstretched Arm Can reach confidently  >25 cm (10")    From Standing Position, Pick up Object from Floor Able to pick up shoe safely and easily    From Standing Position, Turn to Look Behind Over each Shoulder Looks behind from both sides and weight shifts well    Turn 360 Degrees Able to turn 360 degrees safely in 4 seconds or less    Standing Unsupported, Alternately Place Feet on Step/Stool Able to stand independently and safely and complete 8 steps in 20 seconds    Standing Unsupported, One Foot in Front Able to place foot tandem independently and hold 30 seconds    Standing on One Leg Able to lift leg independently and hold > 10 seconds    Total Score 56      Dynamic Gait Index   Level Surface Normal    Change in Gait Speed Normal    Gait with Horizontal Head Turns Normal    Gait with Vertical Head Turns Normal    Gait and Pivot Turn Normal    Step Over Obstacle Normal    Step Around Obstacles Normal    Steps Normal    Total Score 24                        Objective measurements completed on examination: See above findings.            Balance Exercises - 02/13/21 0001       Balance Exercises: Standing   Tandem Stance Eyes open;2 reps    SLS Eyes open;2 reps;Time    SLS Time 60 seconds                PT Education - 02/13/21 1133     Education Details not to use crocs to walk in always have a lace up shoe on; HEP    Person(s) Educated Patient;Spouse    Methods Explanation;Demonstration    Comprehension Returned demonstration;Verbalized understanding              PT Short Term Goals - 02/13/21 1334       PT SHORT TERM GOAL #1   Title PT will states that he is completing his HEP to assist in improving his overall balance.  Time 2    Period Weeks    Status New    Target Date 02/27/21      PT SHORT TERM GOAL #2   Title PT will report that he is only wearing lace up shoes for improved balance    Time 3    Period Weeks    Status New               PT Long  Term Goals - 02/13/21 1336       PT LONG TERM GOAL #1   Title PT to state that he is able to put his pants on standing up again    Time 4    Period Weeks    Status New    Target Date 03/13/21                    Plan - 02/13/21 1134     Clinical Impression Statement Douglas Reynolds is a 76 yo male accompanied by his wife secondary to early dementia.  The pt is referred for balance, however how he presents in the department vs. how he and his wife states he is at home are vastly different.  The therapist was unable to decern any significant balance deficit.  The pt wife states that at home he is always in his crocs which might be some of the problem therefore the therapist suggests that he always wears lace up shoes.  Mr. Blair Hailey will be seen one time a week for high balance activity and to provide a HEP to improve his current balance to decrease his number of falls.    Examination-Activity Limitations Other    Examination-Participation Restrictions Community Activity    Stability/Clinical Decision Making Stable/Uncomplicated    Clinical Decision Making Low    Rehab Potential Excellent    PT Frequency 1x / week    PT Duration 4 weeks    PT Treatment/Interventions Patient/family education;Balance training    PT Next Visit Plan vector stances, tree pose, sit to stand, warrior, foam walking.Marland KitchenMarland KitchenMarland KitchenPt will only be coming once a week to update HEP please give new exercises    PT Home Exercise Plan single leg stance, tandem stance.    Consulted and Agree with Plan of Care Patient             Patient will benefit from skilled therapeutic intervention in order to improve the following deficits and impairments:  Decreased balance  Visit Diagnosis: Repeated falls     Problem List Patient Active Problem List   Diagnosis Date Noted   Psoriatic arthritis ([place]) 03/27/2011   Hyperlipidemia 03/27/2011   Hypertension 03/27/2011   Rayetta Humphrey, PT CLT [phone]   02/13/2021, 1:41 PM  [facility] [address] [place], [place], [address] Phone: [phone]   Fax:  [phone]  Name: Douglas Reynolds MRN: [id number] Date of Birth: 14-Oct-1945

## 2021-02-22 DIAGNOSIS — Z79899 Other long term (current) drug therapy: Secondary | ICD-10-CM | POA: Diagnosis not present

## 2021-02-22 DIAGNOSIS — L4050 Arthropathic psoriasis, unspecified: Secondary | ICD-10-CM | POA: Diagnosis not present

## 2021-02-23 ENCOUNTER — Ambulatory Visit (HOSPITAL_COMMUNITY): Payer: Medicare Other

## 2021-02-23 ENCOUNTER — Encounter (HOSPITAL_COMMUNITY): Payer: Self-pay

## 2021-02-23 ENCOUNTER — Other Ambulatory Visit: Payer: Self-pay

## 2021-02-23 DIAGNOSIS — R296 Repeated falls: Secondary | ICD-10-CM | POA: Diagnosis not present

## 2021-02-23 NOTE — Therapy (Signed)
[facility] [place], [place], [address] Phone: [phone]   Fax:  [phone]  Physical Therapy Treatment  Patient Details  Name: BLAIR LUNDEEN MRN: [id number] Date of Birth: 06-08-1945 Referring Provider (PT): Leigh Aurora   Encounter Date: 02/23/2021   PT End of Session - 02/23/21 0840     Visit Number 2    Number of Visits 4    Date for PT Re-Evaluation 03/15/21    Authorization Type medicare/BCBS    Progress Note Due on Visit 4    PT Start Time 0831    PT Stop Time 0912    PT Time Calculation (min) 41 min    Equipment Utilized During Treatment --   SBA   Activity Tolerance Patient tolerated treatment well    Behavior During Therapy [facility] for tasks assessed/performed             Past Medical History:  Diagnosis Date   Dementia ([place])    Hyperlipidemia 03/27/2011   Hypertension 03/27/2011   Psoriatic arthritis ([place]) 03/27/2011    History reviewed. No pertinent surgical history.  There were no vitals filed for this visit.   Subjective Assessment - 02/23/21 0835     Subjective No report of fall since last session, continues to wobble around.    Patient Stated Goals Not to lose his balance as easily as he does.    Currently in Pain? Yes    Pain Score 3     Pain Location Back    Pain Orientation Lower    Pain Descriptors / Indicators Aching;Dull    Pain Type Chronic pain    Pain Onset More than a month ago    Pain Frequency Constant                               [facility] Adult PT Treatment/Exercise - 02/23/21 0001       Exercises   Exercises Knee/Hip      Knee/Hip Exercises: Standing   Heel Raises 15 reps                 Balance Exercises - 02/23/21 0001       Balance Exercises: Standing   Tandem Stance Eyes open;Foam/compliant surface;3 reps;30 secs   last set on foam with 5 head turns   SLS Eyes open;Solid surface;3 reps;30 secs    SLS Time head turns     SLS with Vectors 3 reps   3x 5" holds   Wall Bumps Shoulder;Hip;10 reps;Eyes opened    Balance Beam tandem and sidestep 2RT    Tandem Gait 1 rep    Heel Raises 15 reps    Sit to Stand Standard surface   eccentric control and power up               PT Education - 02/23/21 0839     Education Details Reviewed goals, educated importance of HEP compliance for maximal benefit    Person(s) Educated Patient    Methods Explanation    Comprehension Verbalized understanding;Returned demonstration              PT Short Term Goals - 02/13/21 1334       PT SHORT TERM GOAL #1   Title PT will states that he is completing his HEP to assist in improving his overall balance.    Time 2    Period Weeks    Status  New    Target Date 02/27/21      PT SHORT TERM GOAL #2   Title PT will report that he is only wearing lace up shoes for improved balance    Time 3    Period Weeks    Status New               PT Long Term Goals - 02/13/21 1336       PT LONG TERM GOAL #1   Title PT to state that he is able to put his pants on standing up again    Time 4    Period Weeks    Status New    Target Date 03/13/21                   Plan - 02/23/21 0092     Clinical Impression Statement Reviewed goals, educated importance of HEP compliance for maximal benefits.  Pt able to recall and demonstrate appropriate form.  Session focus with balance training.  Good static balance, increased difficulty with head turns or dynamic surface.  Cueing to improve posture and abdominal sets to reduce posterior lean.  Added vector stance to HEP, encouraged to complete near counter at home.    Examination-Activity Limitations Other    Examination-Participation Restrictions Community Activity    Stability/Clinical Decision Making Stable/Uncomplicated    Clinical Decision Making Low    Rehab Potential Excellent    PT Frequency 1x / week    PT Duration 4 weeks    PT Treatment/Interventions  Patient/family education;Balance training    PT Next Visit Plan vector stances, tree pose, sit to stand, warrior, foam walking.Marland KitchenMarland KitchenMarland KitchenPt will only be coming once a week to update HEP please give new exercises    PT Home Exercise Plan single leg stance, tandem stance; 2/23: vector stance    Consulted and Agree with Plan of Care Patient             Patient will benefit from skilled therapeutic intervention in order to improve the following deficits and impairments:  Decreased balance  Visit Diagnosis: Repeated falls     Problem List Patient Active Problem List   Diagnosis Date Noted   Psoriatic arthritis ([place]) 03/27/2011   Hyperlipidemia 03/27/2011   Hypertension 03/27/2011   Ihor Austin, LPTA/CLT; CBIS [phone]  Aldona Lento, PTA 02/23/2021, 9:36 AM  [facility] [place], [place], [address] Phone: [phone]   Fax:  [phone]  Name: Douglas Reynolds MRN: [id number] Date of Birth: 12-01-1945

## 2021-02-23 NOTE — Patient Instructions (Signed)
Vector Stance     Standing tall near countertop.  Bring leg forward hold for 5", then out to side for 5", and then extend hip hold for 5".   Repeat 5 times each leg.

## 2021-02-28 ENCOUNTER — Other Ambulatory Visit: Payer: Self-pay

## 2021-02-28 ENCOUNTER — Ambulatory Visit (HOSPITAL_COMMUNITY): Payer: Medicare Other | Admitting: Physical Therapy

## 2021-02-28 DIAGNOSIS — R296 Repeated falls: Secondary | ICD-10-CM

## 2021-02-28 NOTE — Therapy (Signed)
[facility] [place], [place], [address] Phone: [phone]   Fax:  [phone]  Physical Therapy Treatment  Patient Details  Name: Douglas Reynolds MRN: [id number] Date of Birth: 06-19-1945 Referring Provider (PT): Leigh Aurora   Encounter Date: 02/28/2021   PT End of Session - 02/28/21 0904     Visit Number 3    Number of Visits 4    Date for PT Re-Evaluation 03/15/21    Authorization Type medicare/BCBS    Progress Note Due on Visit 4    PT Start Time 0901    PT Stop Time 0940    PT Time Calculation (min) 39 min    Equipment Utilized During Treatment --   SBA   Activity Tolerance Patient tolerated treatment well    Behavior During Therapy [facility] for tasks assessed/performed             Past Medical History:  Diagnosis Date   Dementia ([place])    Hyperlipidemia 03/27/2011   Hypertension 03/27/2011   Psoriatic arthritis ([place]) 03/27/2011    No past surgical history on file.  There were no vitals filed for this visit.   Subjective Assessment - 02/28/21 0903     Subjective Patient says he had a rough day last week. After he left therapy a tree fell down at his house, he cut it up and it wore him out.    Patient Stated Goals Not to lose his balance as easily as he does.    Currently in Pain? Yes    Pain Score 5     Pain Location Back    Pain Orientation Lower;Posterior    Pain Descriptors / Indicators Aching    Pain Type Chronic pain    Pain Onset More than a month ago                               [facility] Adult PT Treatment/Exercise - 02/28/21 0001       Knee/Hip Exercises: Standing   Heel Raises 20 reps    Heel Raises Limitations TR x 20    Hip Abduction Both;2 sets;10 reps    Forward Step Up Both;2 sets;10 reps;Hand Hold: 2;Step Height: 6"    Other Standing Knee Exercises 6 inch step taps x20 each int HHA, tandem stance 3 x 30"      Knee/Hip Exercises: Seated   Sit to Sand 10  reps;with UE support                 Balance Exercises - 02/28/21 0001       Balance Exercises: Standing   Tandem Gait 3 reps    Sidestepping 3 reps                  PT Short Term Goals - 02/13/21 1334       PT SHORT TERM GOAL #1   Title PT will states that he is completing his HEP to assist in improving his overall balance.    Time 2    Period Weeks    Status New    Target Date 02/27/21      PT SHORT TERM GOAL #2   Title PT will report that he is only wearing lace up shoes for improved balance    Time 3    Period Weeks    Status New  PT Long Term Goals - 02/13/21 1336       PT LONG TERM GOAL #1   Title PT to state that he is able to put his pants on standing up again    Time 4    Period Weeks    Status New    Target Date 03/13/21                   Plan - 02/28/21 0940     Clinical Impression Statement Patient tolerated session well today. Patient showing good static balance and good tolerance with added dynamic balance activity. Progressed LE strengthening today. Patient educated on proper form and function of all added exercise. Patient did note some soreness in LT hip with standing hip abduction but improved with rest. Patient will continue to benefit from skilled therapy services to reduce deficits and improve functional level.    Examination-Activity Limitations Other    Examination-Participation Restrictions Community Activity    Stability/Clinical Decision Making Stable/Uncomplicated    Rehab Potential Excellent    PT Frequency 1x / week    PT Duration 4 weeks    PT Treatment/Interventions Patient/family education;Balance training    PT Next Visit Plan vector stances, tree pose, sit to stand, warrior, foam walking.Marland KitchenMarland KitchenMarland KitchenPt will only be coming once a week to update HEP please give new exercises    PT Home Exercise Plan single leg stance, tandem stance; 2/23: vector stance 2/28 sit to stand, heel/toe raise    Consulted  and Agree with Plan of Care Patient             Patient will benefit from skilled therapeutic intervention in order to improve the following deficits and impairments:  Decreased balance  Visit Diagnosis: Repeated falls     Problem List Patient Active Problem List   Diagnosis Date Noted   Psoriatic arthritis ([place]) 03/27/2011   Hyperlipidemia 03/27/2011   Hypertension 03/27/2011   9:43 AM, 02/28/21 Josue Hector PT DPT  Physical Therapist with [facility]  [phone] [place] [address] [place], [place], [address] Phone: [phone]   Fax:  [phone]  Name: Douglas Reynolds MRN: [id number] Date of Birth: May 11, 1945

## 2021-02-28 NOTE — Patient Instructions (Signed)
Access Code: [email or web address] URL: [email or web address] Date: 02/28/2021 Prepared by: Josue Hector  Exercises Sit to Stand Without Arm Support - 2 x daily - 7 x weekly - 1-2 sets - 10 reps Heel Toe Raises with Counter Support - 2 x daily - 7 x weekly - 2 sets - 10 reps

## 2021-03-07 ENCOUNTER — Encounter (HOSPITAL_COMMUNITY): Payer: Self-pay

## 2021-03-07 ENCOUNTER — Other Ambulatory Visit: Payer: Self-pay

## 2021-03-07 ENCOUNTER — Ambulatory Visit (HOSPITAL_COMMUNITY): Payer: Medicare Other | Attending: Internal Medicine

## 2021-03-07 DIAGNOSIS — R296 Repeated falls: Secondary | ICD-10-CM

## 2021-03-07 NOTE — Therapy (Addendum)
[facility] ?[address] ?[place], [place], [address] ?Phone: [phone]   Fax:  [phone] ? ?Physical Therapy Treatment ? ?Patient Details  ?Name: Douglas Reynolds ?MRN: [id number] ?Date of Birth: Apr 04, 1945 ?Referring Provider (PT): Leigh Aurora ? ?PHYSICAL THERAPY DISCHARGE SUMMARY ? ?Visits from Start of Care: 4 ? ?Current functional level related to goals / functional outcomes: ?Goals have been met  ?  ?Remaining deficits: ?None  ?  ?Education / Equipment: ?HEP  ? ?Patient agrees to discharge. Patient goals were met. Patient is being discharged due to being pleased with the current functional level.  ?Encounter Date: 03/07/2021 ? ? PT End of Session - 03/07/21 1042   ? ? Visit Number 4   ? Number of Visits 4   ? Date for PT Re-Evaluation 03/15/21   ? Authorization Type medicare/BCBS   ? Progress Note Due on Visit 4   ? PT Start Time 1002   ? PT Stop Time 0254   ? PT Time Calculation (min) 38 min   ? Equipment Utilized During Treatment --   SBA  ? Activity Tolerance Patient tolerated treatment well   ? Behavior During Therapy [facility] for tasks assessed/performed   ? ?  ?  ? ?  ? ? ?Past Medical History:  ?Diagnosis Date  ? Dementia ([place])   ? Hyperlipidemia 03/27/2011  ? Hypertension 03/27/2011  ? Psoriatic arthritis ([place]) 03/27/2011  ? ? ?History reviewed. No pertinent surgical history. ? ?There were no vitals filed for this visit. ? ? Subjective Assessment - 03/07/21 1008   ? ? Subjective Pt stated he is always in some pain.   ? Currently in Pain? Yes   ? Pain Score 2    ? Pain Location Hip   ? Pain Orientation Left   ? Pain Descriptors / Indicators Aching   ? Pain Type Chronic pain   ? Pain Onset More than a month ago   ? Pain Frequency Constant   ? ?  ?  ? ?  ? ? ? ? ? ? ? ? ? ? ? ? ? ? ? ? ? ? ? ? ? ? ? ? ? Balance Exercises - 03/07/21 0001   ? ?  ? Balance Exercises: Standing  ? Tandem Stance Eyes open;3 reps;30 secs   last set head turns  ? SLS 3 reps   ? SLS  Time BLE 60"   ? Balance Master: Limits for Stability 4'   ? Balance Master: Dynamic A 49, B51   ? Balance Beam tandem and sidestep 2RT   ? Tandem Gait 1 rep   ? ?  ?  ? ?  ? ? ? ? ? ? ? PT Short Term Goals - 03/07/21 1010   ? ?  ? PT SHORT TERM GOAL #1  ? Title PT will states that he is completing his HEP to assist in improving his overall balance.   ? Baseline 03/07/21:  Reports compliance with HEP daily   ?  ? PT SHORT TERM GOAL #2  ? Title PT will report that he is only wearing lace up shoes for improved balance   ? Baseline 03/07/21:  Wears lace up outside house, wears slippers at home   ? Status On-going   ? ?  ?  ? ?  ? ? ? ? PT Long Term Goals - 03/07/21 1023   ? ?  ? PT LONG TERM GOAL #1  ? Title PT to state  that he is able to put his pants on standing up again   ? Baseline 03/07/21:  Reports ability to put on pants while standing   ? Status Achieved   ? ?  ?  ? ?  ? ? ? ? ? ? ? ? Plan - 03/07/21 1042   ? ? Clinical Impression Statement Reviewed goals with 2/2 STG and 1/1 LTG met.  Pt able to SLS 60" BLE, able to put on pants while standing, reports compliance wiht HEP and no reports of falls.  Reviewed current exercises to continue at home.   ? Examination-Activity Limitations Other   ? Examination-Participation Restrictions Community Activity   ? Stability/Clinical Decision Making Stable/Uncomplicated   ? Clinical Decision Making Low   ? Rehab Potential Excellent   ? PT Frequency 1x / week   ? PT Duration 4 weeks   ? PT Treatment/Interventions Patient/family education;Balance training   ? PT Next Visit Plan DC to HEP.   ? PT Home Exercise Plan single leg stance, tandem stance; 2/23: vector stance 2/28 sit to stand, heel/toe raise   ? Consulted and Agree with Plan of Care Patient   ? ?  ?  ? ?  ? ? ?Patient will benefit from skilled therapeutic intervention in order to improve the following deficits and impairments:  Decreased balance ? ?Visit Diagnosis: ?Repeated falls ? ? ? ? ?Problem List ?Patient Active  Problem List  ? Diagnosis Date Noted  ? Psoriatic arthritis ([place]) 03/27/2011  ? Hyperlipidemia 03/27/2011  ? Hypertension 03/27/2011  ? ?Ihor Austin, LPTA/CLT; CBIS ?[phone] ?Rayetta Humphrey, PT CLT ?[phone]  ?03/07/2021, 1:05 PM ? ?[place] ?[place] ?[address] ?[place], [place], [address] ?Phone: [phone]   Fax:  [phone] ? ?Name: Douglas Reynolds ?MRN: [id number] ?Date of Birth: [birth year] ? ? ? ?

## 2021-03-10 DIAGNOSIS — L4050 Arthropathic psoriasis, unspecified: Secondary | ICD-10-CM | POA: Diagnosis not present

## 2021-03-15 ENCOUNTER — Encounter (HOSPITAL_COMMUNITY): Payer: Medicare Other | Admitting: Physical Therapy

## 2021-04-10 DIAGNOSIS — L4050 Arthropathic psoriasis, unspecified: Secondary | ICD-10-CM | POA: Diagnosis not present

## 2021-04-12 DIAGNOSIS — U071 COVID-19: Secondary | ICD-10-CM | POA: Diagnosis not present

## 2021-04-28 DIAGNOSIS — E782 Mixed hyperlipidemia: Secondary | ICD-10-CM | POA: Diagnosis not present

## 2021-04-28 DIAGNOSIS — R7303 Prediabetes: Secondary | ICD-10-CM | POA: Diagnosis not present

## 2021-04-28 DIAGNOSIS — Z125 Encounter for screening for malignant neoplasm of prostate: Secondary | ICD-10-CM | POA: Diagnosis not present

## 2021-05-02 DIAGNOSIS — M25552 Pain in left hip: Secondary | ICD-10-CM | POA: Diagnosis not present

## 2021-05-02 DIAGNOSIS — R634 Abnormal weight loss: Secondary | ICD-10-CM | POA: Diagnosis not present

## 2021-05-02 DIAGNOSIS — J310 Chronic rhinitis: Secondary | ICD-10-CM | POA: Diagnosis not present

## 2021-05-02 DIAGNOSIS — L4050 Arthropathic psoriasis, unspecified: Secondary | ICD-10-CM | POA: Diagnosis not present

## 2021-05-02 DIAGNOSIS — M545 Low back pain, unspecified: Secondary | ICD-10-CM | POA: Diagnosis not present

## 2021-05-02 DIAGNOSIS — M791 Myalgia, unspecified site: Secondary | ICD-10-CM | POA: Diagnosis not present

## 2021-05-02 DIAGNOSIS — R972 Elevated prostate specific antigen [PSA]: Secondary | ICD-10-CM | POA: Diagnosis not present

## 2021-05-02 DIAGNOSIS — G3184 Mild cognitive impairment, so stated: Secondary | ICD-10-CM | POA: Diagnosis not present

## 2021-05-02 DIAGNOSIS — R945 Abnormal results of liver function studies: Secondary | ICD-10-CM | POA: Diagnosis not present

## 2021-05-02 DIAGNOSIS — I10 Essential (primary) hypertension: Secondary | ICD-10-CM | POA: Diagnosis not present

## 2021-05-02 DIAGNOSIS — E782 Mixed hyperlipidemia: Secondary | ICD-10-CM | POA: Diagnosis not present

## 2021-05-02 DIAGNOSIS — R7303 Prediabetes: Secondary | ICD-10-CM | POA: Diagnosis not present

## 2021-05-08 DIAGNOSIS — Z6825 Body mass index (BMI) 25.0-25.9, adult: Secondary | ICD-10-CM | POA: Diagnosis not present

## 2021-05-08 DIAGNOSIS — M79675 Pain in left toe(s): Secondary | ICD-10-CM | POA: Diagnosis not present

## 2021-05-08 DIAGNOSIS — M1991 Primary osteoarthritis, unspecified site: Secondary | ICD-10-CM | POA: Diagnosis not present

## 2021-05-08 DIAGNOSIS — E663 Overweight: Secondary | ICD-10-CM | POA: Diagnosis not present

## 2021-05-08 DIAGNOSIS — M8580 Other specified disorders of bone density and structure, unspecified site: Secondary | ICD-10-CM | POA: Diagnosis not present

## 2021-05-08 DIAGNOSIS — M25551 Pain in right hip: Secondary | ICD-10-CM | POA: Diagnosis not present

## 2021-05-08 DIAGNOSIS — L4050 Arthropathic psoriasis, unspecified: Secondary | ICD-10-CM | POA: Diagnosis not present

## 2021-05-08 DIAGNOSIS — M25552 Pain in left hip: Secondary | ICD-10-CM | POA: Diagnosis not present

## 2021-05-08 DIAGNOSIS — M5136 Other intervertebral disc degeneration, lumbar region: Secondary | ICD-10-CM | POA: Diagnosis not present

## 2021-05-08 DIAGNOSIS — L401 Generalized pustular psoriasis: Secondary | ICD-10-CM | POA: Diagnosis not present

## 2021-05-09 DIAGNOSIS — Z79899 Other long term (current) drug therapy: Secondary | ICD-10-CM | POA: Diagnosis not present

## 2021-05-09 DIAGNOSIS — L4050 Arthropathic psoriasis, unspecified: Secondary | ICD-10-CM | POA: Diagnosis not present

## 2021-08-02 DIAGNOSIS — R972 Elevated prostate specific antigen [PSA]: Secondary | ICD-10-CM | POA: Diagnosis not present

## 2021-08-02 DIAGNOSIS — E782 Mixed hyperlipidemia: Secondary | ICD-10-CM | POA: Diagnosis not present

## 2021-08-02 DIAGNOSIS — R7303 Prediabetes: Secondary | ICD-10-CM | POA: Diagnosis not present

## 2021-08-09 DIAGNOSIS — M791 Myalgia, unspecified site: Secondary | ICD-10-CM | POA: Diagnosis not present

## 2021-08-09 DIAGNOSIS — Z0000 Encounter for general adult medical examination without abnormal findings: Secondary | ICD-10-CM | POA: Diagnosis not present

## 2021-08-09 DIAGNOSIS — E782 Mixed hyperlipidemia: Secondary | ICD-10-CM | POA: Diagnosis not present

## 2021-08-09 DIAGNOSIS — G3184 Mild cognitive impairment, so stated: Secondary | ICD-10-CM | POA: Diagnosis not present

## 2021-08-09 DIAGNOSIS — R7303 Prediabetes: Secondary | ICD-10-CM | POA: Diagnosis not present

## 2021-08-09 DIAGNOSIS — M545 Low back pain, unspecified: Secondary | ICD-10-CM | POA: Diagnosis not present

## 2021-08-09 DIAGNOSIS — R634 Abnormal weight loss: Secondary | ICD-10-CM | POA: Diagnosis not present

## 2021-08-09 DIAGNOSIS — R945 Abnormal results of liver function studies: Secondary | ICD-10-CM | POA: Diagnosis not present

## 2021-08-09 DIAGNOSIS — I10 Essential (primary) hypertension: Secondary | ICD-10-CM | POA: Diagnosis not present

## 2021-08-09 DIAGNOSIS — J310 Chronic rhinitis: Secondary | ICD-10-CM | POA: Diagnosis not present

## 2021-08-09 DIAGNOSIS — L4050 Arthropathic psoriasis, unspecified: Secondary | ICD-10-CM | POA: Diagnosis not present

## 2021-08-09 DIAGNOSIS — M25552 Pain in left hip: Secondary | ICD-10-CM | POA: Diagnosis not present

## 2021-09-12 DIAGNOSIS — L4050 Arthropathic psoriasis, unspecified: Secondary | ICD-10-CM | POA: Diagnosis not present

## 2021-09-12 DIAGNOSIS — M8580 Other specified disorders of bone density and structure, unspecified site: Secondary | ICD-10-CM | POA: Diagnosis not present

## 2021-09-12 DIAGNOSIS — Z6824 Body mass index (BMI) 24.0-24.9, adult: Secondary | ICD-10-CM | POA: Diagnosis not present

## 2021-09-12 DIAGNOSIS — M79675 Pain in left toe(s): Secondary | ICD-10-CM | POA: Diagnosis not present

## 2021-09-12 DIAGNOSIS — M25551 Pain in right hip: Secondary | ICD-10-CM | POA: Diagnosis not present

## 2021-09-12 DIAGNOSIS — M5136 Other intervertebral disc degeneration, lumbar region: Secondary | ICD-10-CM | POA: Diagnosis not present

## 2021-09-12 DIAGNOSIS — L401 Generalized pustular psoriasis: Secondary | ICD-10-CM | POA: Diagnosis not present

## 2021-09-12 DIAGNOSIS — M25552 Pain in left hip: Secondary | ICD-10-CM | POA: Diagnosis not present

## 2021-09-12 DIAGNOSIS — M1991 Primary osteoarthritis, unspecified site: Secondary | ICD-10-CM | POA: Diagnosis not present

## 2021-09-25 DIAGNOSIS — Z23 Encounter for immunization: Secondary | ICD-10-CM | POA: Diagnosis not present

## 2021-10-06 DIAGNOSIS — Z23 Encounter for immunization: Secondary | ICD-10-CM | POA: Diagnosis not present

## 2021-11-01 DIAGNOSIS — H25813 Combined forms of age-related cataract, bilateral: Secondary | ICD-10-CM | POA: Diagnosis not present

## 2021-11-20 DIAGNOSIS — H01002 Unspecified blepharitis right lower eyelid: Secondary | ICD-10-CM | POA: Diagnosis not present

## 2021-11-20 DIAGNOSIS — H25813 Combined forms of age-related cataract, bilateral: Secondary | ICD-10-CM | POA: Diagnosis not present

## 2021-11-20 DIAGNOSIS — H01001 Unspecified blepharitis right upper eyelid: Secondary | ICD-10-CM | POA: Diagnosis not present

## 2021-11-20 DIAGNOSIS — H01004 Unspecified blepharitis left upper eyelid: Secondary | ICD-10-CM | POA: Diagnosis not present

## 2021-11-20 DIAGNOSIS — H40051 Ocular hypertension, right eye: Secondary | ICD-10-CM | POA: Diagnosis not present

## 2021-12-04 DIAGNOSIS — H25012 Cortical age-related cataract, left eye: Secondary | ICD-10-CM | POA: Diagnosis not present

## 2021-12-06 ENCOUNTER — Encounter (HOSPITAL_COMMUNITY): Payer: Self-pay

## 2021-12-06 ENCOUNTER — Encounter (HOSPITAL_COMMUNITY)
Admission: RE | Admit: 2021-12-06 | Discharge: 2021-12-06 | Disposition: A | Discharge status: Home / Self Care | Payer: Medicare Other | Source: Ambulatory Visit | Attending: Ophthalmology | Admitting: Ophthalmology

## 2021-12-06 NOTE — H&P (Signed)
Surgical History & Physical  Patient Name: Douglas Reynolds DOB: 1945/06/09  Surgery: Cataract extraction with intraocular lens implant phacoemulsification; Left Eye  Surgeon: Baruch Goldmann MD Surgery Date:  12-11-21 Pre-Op Date:  11-20-21  HPI: A 7 Yr. old male patient present for cataract evaluation per Dr. Jorja Loa. 1. 1. The patient complains of difficulty when driving due to glare during the daytime. He doesn't go out at night. Which began 1 year ago. Both eyes are affected. The episode is gradual. The condition's severity is worsening. Looking at the speedometer he is unable to see the numbers. He can only see the yellow needle. White color is looking yellow to him. This is negatively affecting the patient's quality of life and the patient is unable to function adequately in life with the current level of vision. HPI was performed by Baruch Goldmann .  Medical History: Cataracts Arthritis High Blood Pressure LDL  Review of Systems Cardiovascular High Blood Pressure Musculoskeletal arthritis All recorded systems are negative except as noted above.  Social   Never smoked   Medication Melatonin, Lisinopril, Cosentyx, Folic acid, Flaxseed oil, Pravastatin, Prednisone, Methotrexate, Donepezil,   Sx/Procedures Cut knee with chain saw,   Drug Allergies   NKDA  History & Physical: Heent: Cataract, left eye NECK: supple without bruits LUNGS: lungs clear to auscultation CV: regular rate and rhythm Abdomen: soft and non-tender Impression & Plan: Assessment: 1.  COMBINED FORMS AGE RELATED CATARACT; Both Eyes (H25.813) 2.  BLEPHARITIS; Right Upper Lid, Right Lower Lid, Left Upper Lid, Left Lower Lid (H01.001, H01.002,H01.004,H01.005) 3.  ASTIGMATISM, REGULAR; Both Eyes (H52.223) 4.  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  Cataract accounts for the patient's decreased vision. This visual impairment is not correctable with a tolerable change in glasses or contact  lenses. Cataract surgery with an implantation of a new lens should significantly improve the visual and functional status of the patient. Discussed all risks, benefits, alternatives, and potential complications. Discussed the procedures and recovery. Patient desires to have surgery. A-scan ordered and performed today for intra-ocular lens calculations. The surgery will be performed in order to improve vision for driving, reading, and for eye examinations. Recommend phacoemulsification with intra-ocular lens. Recommend Dextenza for post-operative pain and inflammation. Left Eye worse - first. Dilates poorly - shugarcaine by protocol. Malyugin Ring. Omidira. Consider toric IOL. Patient is myopic - patient will consider vision goal, leaning toward near vision.  2.  Recommend regular lid cleaning.  3.  Consider Toric IOL.  4.  Mildly elevated. OCT rNFL shows no thinning at this point. Recommend cataract surgery to improved IOP.

## 2021-12-11 ENCOUNTER — Ambulatory Visit (HOSPITAL_COMMUNITY): Payer: Medicare Other | Admitting: Anesthesiology

## 2021-12-11 ENCOUNTER — Ambulatory Visit (HOSPITAL_BASED_OUTPATIENT_CLINIC_OR_DEPARTMENT_OTHER): Payer: Medicare Other | Admitting: Anesthesiology

## 2021-12-11 ENCOUNTER — Encounter (HOSPITAL_COMMUNITY): Payer: Self-pay | Admitting: Ophthalmology

## 2021-12-11 ENCOUNTER — Ambulatory Visit (HOSPITAL_COMMUNITY)
Admission: RE | Admit: 2021-12-11 | Discharge: 2021-12-11 | Disposition: A | Discharge status: Home / Self Care | Payer: Medicare Other | Attending: Ophthalmology | Admitting: Ophthalmology

## 2021-12-11 ENCOUNTER — Encounter (HOSPITAL_COMMUNITY)
Admission: RE | Disposition: A | Discharge status: Home / Self Care | Payer: Self-pay | Source: Home / Self Care | Attending: Ophthalmology

## 2021-12-11 DIAGNOSIS — F0390 Unspecified dementia without behavioral disturbance: Secondary | ICD-10-CM | POA: Diagnosis not present

## 2021-12-11 DIAGNOSIS — H0100A Unspecified blepharitis right eye, upper and lower eyelids: Secondary | ICD-10-CM | POA: Insufficient documentation

## 2021-12-11 DIAGNOSIS — H0100B Unspecified blepharitis left eye, upper and lower eyelids: Secondary | ICD-10-CM | POA: Diagnosis not present

## 2021-12-11 DIAGNOSIS — H40051 Ocular hypertension, right eye: Secondary | ICD-10-CM | POA: Insufficient documentation

## 2021-12-11 DIAGNOSIS — H25012 Cortical age-related cataract, left eye: Secondary | ICD-10-CM | POA: Diagnosis not present

## 2021-12-11 DIAGNOSIS — H52223 Regular astigmatism, bilateral: Secondary | ICD-10-CM | POA: Insufficient documentation

## 2021-12-11 DIAGNOSIS — I10 Essential (primary) hypertension: Secondary | ICD-10-CM | POA: Insufficient documentation

## 2021-12-11 DIAGNOSIS — H25813 Combined forms of age-related cataract, bilateral: Secondary | ICD-10-CM | POA: Insufficient documentation

## 2021-12-11 DIAGNOSIS — H5712 Ocular pain, left eye: Secondary | ICD-10-CM | POA: Diagnosis not present

## 2021-12-11 DIAGNOSIS — M1990 Unspecified osteoarthritis, unspecified site: Secondary | ICD-10-CM | POA: Diagnosis not present

## 2021-12-11 DIAGNOSIS — H25812 Combined forms of age-related cataract, left eye: Secondary | ICD-10-CM | POA: Diagnosis not present

## 2021-12-11 SURGERY — CATARACT EXTRACTION PHACO AND INTRAOCULAR LENS PLACEMENT (IOC) with placement of Corticosteroid
Anesthesia: Monitor Anesthesia Care | Site: Eye | Laterality: Left

## 2021-12-11 MED ORDER — TROPICAMIDE 1 % OP SOLN
1.0000 [drp] | OPHTHALMIC | Status: AC | PRN
  Administered 2021-12-11 (×3): 1 [drp] via OPHTHALMIC

## 2021-12-11 MED ORDER — SODIUM HYALURONATE 10 MG/ML IO SOLUTION
PREFILLED_SYRINGE | INTRAOCULAR | Status: DC | PRN
  Administered 2021-12-11: .85 mL via INTRAOCULAR

## 2021-12-11 MED ORDER — LIDOCAINE HCL (PF) 1 % IJ SOLN
INTRAOCULAR | Status: DC | PRN
  Administered 2021-12-11: 1 mL via OPHTHALMIC

## 2021-12-11 MED ORDER — PHENYLEPHRINE HCL 2.5 % OP SOLN
1.0000 [drp] | OPHTHALMIC | Status: AC | PRN
  Administered 2021-12-11 (×3): 1 [drp] via OPHTHALMIC

## 2021-12-11 MED ORDER — MOXIFLOXACIN HCL 0.5 % OP SOLN
OPHTHALMIC | Status: DC | PRN
  Administered 2021-12-11: .2 mL via OPHTHALMIC

## 2021-12-11 MED ORDER — TETRACAINE HCL 0.5 % OP SOLN
1.0000 [drp] | OPHTHALMIC | Status: AC | PRN
  Administered 2021-12-11 (×3): 1 [drp] via OPHTHALMIC

## 2021-12-11 MED ORDER — DEXAMETHASONE 0.4 MG OP INST
VAGINAL_INSERT | OPHTHALMIC | Status: AC
  Filled 2021-12-11: qty 1

## 2021-12-11 MED ORDER — BSS IO SOLN
INTRAOCULAR | Status: DC | PRN
  Administered 2021-12-11: 15 mL via INTRAOCULAR

## 2021-12-11 MED ORDER — MIDAZOLAM HCL 2 MG/2ML IJ SOLN
INTRAMUSCULAR | Status: AC
  Filled 2021-12-11: qty 2

## 2021-12-11 MED ORDER — DEXAMETHASONE 0.4 MG OP INST
VAGINAL_INSERT | OPHTHALMIC | Status: DC | PRN
  Administered 2021-12-11: .4 mg via OPHTHALMIC

## 2021-12-11 MED ORDER — SODIUM HYALURONATE 23MG/ML IO SOSY
PREFILLED_SYRINGE | INTRAOCULAR | Status: DC | PRN
  Administered 2021-12-11: .6 mL via INTRAOCULAR

## 2021-12-11 MED ORDER — MIDAZOLAM HCL 5 MG/5ML IJ SOLN
INTRAMUSCULAR | Status: DC | PRN
  Administered 2021-12-11: 1 mg via INTRAVENOUS

## 2021-12-11 MED ORDER — PHENYLEPHRINE-KETOROLAC 1-0.3 % IO SOLN
INTRAOCULAR | Status: AC
  Filled 2021-12-11: qty 4

## 2021-12-11 MED ORDER — LIDOCAINE HCL 3.5 % OP GEL
1.0000 | Freq: Once | OPHTHALMIC | Status: AC
  Administered 2021-12-11: 1 via OPHTHALMIC

## 2021-12-11 MED ORDER — POVIDONE-IODINE 5 % OP SOLN
OPHTHALMIC | Status: DC | PRN
  Administered 2021-12-11: 1 via OPHTHALMIC

## 2021-12-11 MED ORDER — EPINEPHRINE PF 1 MG/ML IJ SOLN
INTRAMUSCULAR | Status: AC
  Filled 2021-12-11: qty 2

## 2021-12-11 MED ORDER — STERILE WATER FOR IRRIGATION IR SOLN
Status: DC | PRN
  Administered 2021-12-11: 250 mL

## 2021-12-11 MED ORDER — PHENYLEPHRINE-KETOROLAC 1-0.3 % IO SOLN
INTRAOCULAR | Status: DC | PRN
  Administered 2021-12-11: 500 mL via OPHTHALMIC

## 2021-12-11 MED ORDER — MOXIFLOXACIN HCL 5 MG/ML IO SOLN
INTRAOCULAR | Status: AC
  Filled 2021-12-11: qty 1

## 2021-12-11 SURGICAL SUPPLY — 14 items
CATARACT SUITE SIGHTPATH (MISCELLANEOUS) ×1 IMPLANT
CLOTH BEACON ORANGE TIMEOUT ST (SAFETY) ×1 IMPLANT
EYE SHIELD UNIVERSAL CLEAR (GAUZE/BANDAGES/DRESSINGS) IMPLANT
FEE CATARACT SUITE SIGHTPATH (MISCELLANEOUS) ×1 IMPLANT
GLOVE BIOGEL PI IND STRL 7.0 (GLOVE) ×2 IMPLANT
LENS IOL RAYNER 17.0 (Intraocular Lens) ×1 IMPLANT
LENS IOL RAYONE EMV 17.0 (Intraocular Lens) IMPLANT
NDL HYPO 18GX1.5 BLUNT FILL (NEEDLE) ×1 IMPLANT
NEEDLE HYPO 18GX1.5 BLUNT FILL (NEEDLE) ×1 IMPLANT
PAD ARMBOARD 7.5X6 YLW CONV (MISCELLANEOUS) ×1 IMPLANT
SYR TB 1ML LL NO SAFETY (SYRINGE) ×1 IMPLANT
TAPE SURG TRANSPORE 1 IN (GAUZE/BANDAGES/DRESSINGS) IMPLANT
TAPE SURGICAL TRANSPORE 1 IN (GAUZE/BANDAGES/DRESSINGS) ×1
WATER STERILE IRR 250ML POUR (IV SOLUTION) ×1 IMPLANT

## 2021-12-11 NOTE — Op Note (Signed)
Date of procedure: 12/11/21  Pre-operative diagnosis: Visually significant age-related combined cataract, Left Eye (H25.812)  Post-operative diagnosis:  Visually significant age-related combined cataract, Left Eye (H25.812) 2.   Pain and inflammation following cataract surgery, Left Eye (H57.12)  Procedure:  Removal of cataract via phacoemulsification and insertion of intra-ocular lens Rayner RAO200E +17.0D into the capsular bag of the Left Eye 2. Placement of Dextenza Implant, Left Lower Lid  Attending surgeon: Gerda Diss. Shamaine Mulkern, MD, MA  Anesthesia: MAC, Topical Akten  Complications: None  Estimated Blood Loss: <93m (minimal)  Specimens: None  Implants: As above  Indications:  Visually significant age-related cataract, Left Eye  Procedure:  The patient was seen and identified in the pre-operative area. The operative eye was identified and dilated.  The operative eye was marked.  Topical anesthesia was administered to the operative eye.     The patient was then to the operative suite and placed in the supine position.  A timeout was performed confirming the patient, procedure to be performed, and all other relevant information.   The patient's face was prepped and draped in the usual fashion for intra-ocular surgery.  A lid speculum was placed into the operative eye and the surgical microscope moved into place and focused.  An inferotemporal paracentesis was created using a 20 gauge paracentesis blade.  Shugarcaine was injected into the anterior chamber.  Viscoelastic was injected into the anterior chamber.  A temporal clear-corneal main wound incision was created using a 2.449mmicrokeratome.  A continuous curvilinear capsulorrhexis was initiated using an irrigating cystitome and completed using capsulorrhexis forceps.  Hydrodissection and hydrodeliniation were performed.  Viscoelastic was injected into the anterior chamber.  A phacoemulsification handpiece and a chopper as a second  instrument were used to remove the nucleus and epinucleus. The irrigation/aspiration handpiece was used to remove any remaining cortical material.   The capsular bag was reinflated with viscoelastic, checked, and found to be intact.  The intraocular lens was inserted into the capsular bag.  The irrigation/aspiration handpiece was used to remove any remaining viscoelastic.  The clear corneal wound and paracentesis wounds were then hydrated and checked with Weck-Cels to be watertight.  0.49m67mf moxifloxacin was injected into the anterior chamber.  The lid-speculum was removed. The lower punctum was dilated. A Dextenza implant was placed in the lower canaliculus without complication.   The drape was removed.  The patient's face was cleaned with a wet and dry 4x4.   A clear shield was taped over the eye. The patient was taken to the post-operative care unit in good condition, having tolerated the procedure well.  Post-Op Instructions: The [facility]operative evaluation and will receive all other orders and instructions.

## 2021-12-11 NOTE — Discharge Instructions (Signed)
Please discharge patient when stable, will follow up today with Dr. Joy Reiger at the [facility] office immediately following discharge.  Leave shield in place until visit.  All paperwork with discharge instructions will be given at the office.  [facility] Address:  [address]  [place], [place] [address]  

## 2021-12-11 NOTE — Anesthesia Preprocedure Evaluation (Signed)
Anesthesia Evaluation  Patient identified by MRN, date of birth, ID band Patient awake and Patient confused    Reviewed: Allergy & Precautions, H&P , NPO status , Patient's Chart, lab work & pertinent test results, reviewed documented beta blocker date and time   Airway Mallampati: II  TM Distance: >3 FB Neck ROM: full    Dental no notable dental hx.    Pulmonary neg pulmonary ROS   Pulmonary exam normal breath sounds clear to auscultation       Cardiovascular Exercise Tolerance: Good hypertension, negative cardio ROS  Rhythm:regular Rate:Normal     Neuro/Psych  PSYCHIATRIC DISORDERS     Dementia negative neurological ROS  negative psych ROS   GI/Hepatic negative GI ROS, Neg liver ROS,,,  Endo/Other  negative endocrine ROS    Renal/GU negative Renal ROS  negative genitourinary   Musculoskeletal   Abdominal   Peds  Hematology negative hematology ROS (+)   Anesthesia Other Findings   Reproductive/Obstetrics negative OB ROS                             Anesthesia Physical Anesthesia Plan  ASA: 2  Anesthesia Plan: MAC   Post-op Pain Management:    Induction:   PONV Risk Score and Plan:   Airway Management Planned:   Additional Equipment:   Intra-op Plan:   Post-operative Plan:   Informed Consent: I have reviewed the patients History and Physical, chart, labs and discussed the procedure including the risks, benefits and alternatives for the proposed anesthesia with the patient or authorized representative who has indicated his/her understanding and acceptance.     Dental Advisory Given  Plan Discussed with: CRNA  Anesthesia Plan Comments:        Anesthesia Quick Evaluation

## 2021-12-11 NOTE — Anesthesia Postprocedure Evaluation (Signed)
Anesthesia Post Note  Patient: Douglas Reynolds  Procedure(s) Performed: CATARACT EXTRACTION PHACO AND INTRAOCULAR LENS PLACEMENT (IOC) with placement of Corticosteroid (Left: Eye)  Patient location during evaluation: Phase II Anesthesia Type: MAC Level of consciousness: awake Pain management: pain level controlled Vital Signs Assessment: post-procedure vital signs reviewed and stable Respiratory status: spontaneous breathing and respiratory function stable Cardiovascular status: blood pressure returned to baseline and stable Postop Assessment: no headache and no apparent nausea or vomiting Anesthetic complications: no Comments: Late entry   No notable events documented.   Last Vitals:  Vitals:   12/11/21 1002 12/11/21 1054  BP: (!) 135/55 136/65  Pulse: 71 65  Resp: 12 16  Temp: 36.7 C 36.7 C  SpO2: 100% 100%    Last Pain:  Vitals:   12/11/21 1054  TempSrc: Oral  PainSc: 0-No pain                 Louann Sjogren

## 2021-12-11 NOTE — Interval H&P Note (Signed)
History and Physical Interval Note:  12/11/2021 10:24 AM  Douglas Reynolds  has presented today for surgery, with the diagnosis of combined forms age related cataract; left.  The various methods of treatment have been discussed with the patient and family. After consideration of risks, benefits and other options for treatment, the patient has consented to  Procedure(s) with comments: CATARACT EXTRACTION PHACO AND INTRAOCULAR LENS PLACEMENT ([place]) with placement of Corticosteroid (Left) - CDE as a surgical intervention.  The patient's history has been reviewed, patient examined, no change in status, stable for surgery.  I have reviewed the patient's chart and labs.  Questions were answered to the patient's satisfaction.     Baruch Goldmann

## 2021-12-11 NOTE — Transfer of Care (Signed)
Immediate Anesthesia Transfer of Care Note  Patient: Douglas Reynolds  Procedure(s) Performed: CATARACT EXTRACTION PHACO AND INTRAOCULAR LENS PLACEMENT (IOC) with placement of Corticosteroid (Left: Eye)  Patient Location: PACU  Anesthesia Type:MAC  Level of Consciousness: awake, alert , and oriented  Airway & Oxygen Therapy: Patient Spontanous Breathing  Post-op Assessment: Report given to RN and Post -op Vital signs reviewed and stable  Post vital signs: Reviewed and stable  Last Vitals:  Vitals Value Taken Time  BP    Temp    Pulse    Resp    SpO2      Last Pain:  Vitals:   12/11/21 1002  TempSrc: Oral  PainSc: 0-No pain         Complications: No notable events documented.

## 2021-12-18 DIAGNOSIS — H25811 Combined forms of age-related cataract, right eye: Secondary | ICD-10-CM | POA: Diagnosis not present

## 2021-12-19 NOTE — H&P (Signed)
Surgical History & Physical  Patient Name: Douglas Reynolds DOB: 02-03-1945  Surgery: Cataract extraction with intraocular lens implant phacoemulsification; Right Eye  Surgeon: Baruch Goldmann MD Surgery Date:  12-29-21 Pre-Op Date:  12-18-21  HPI: A 57 Yr. old male patient 1. The patient is returning after cataract surgery. The left eye is affected. Status post cataract surgery, which began 1 weeks ago: Since the last visit, the affected area is doing well. The patient's vision is improved. Patient is following medication instructions. 2. 2. The patient is returning for a cataract follow-up of the right eye. Since the last visit, the affected area is worsening. The patient's vision is blurry. The complaint is associated with difficulty seeing screws/project items, difficulty seeing at night, difficulty with glare on bright sunny days, and difficulty recognizing people at a distance. This is negatively affecting the patient's quality of life and the patient is unable to function adequately in life with the current level of vision. HPI was performed by Baruch Goldmann .  Medical History: Cataracts Arthritis High Blood Pressure LDL  Review of Systems Cardiovascular High Blood Pressure Musculoskeletal arthritis All recorded systems are negative except as noted above.  Social   Never smoked   Medication Prednisolone-Moxifloxacin-Bromfenac,  Melatonin, Lisinopril, Cosentyx, Folic acid, Flaxseed oil, Pravastatin, Prednisone, Methotrexate, Donepezil,   Sx/Procedures Phaco c IOL OS with Dextenza,  Cut knee with chain saw,   Drug Allergies   NKDA  History & Physical: Heent: Cataract, right eye NECK: supple without bruits LUNGS: lungs clear to auscultation CV: regular rate and rhythm Abdomen: soft and non-tender Impression & Plan: Assessment: 1.  CATARACT EXTRACTION STATUS; Left Eye (Z98.42) 2.  COMBINED FORMS AGE RELATED CATARACT; Right Eye (H25.811)  Plan: 1.  1 week after  cataract surgery. Doing well with improved vision and normal eye pressure. Call with any problems or concerns. Continue Pred-Moxi-Brom 2x/day for 3 more weeks.  2.  Cataract accounts for the patient's decreased vision. This visual impairment is not correctable with a tolerable change in glasses or contact lenses. Cataract surgery with an implantation of a new lens should significantly improve the visual and functional status of the patient. Discussed all risks, benefits, alternatives, and potential complications. Discussed the procedures and recovery. Patient desires to have surgery. A-scan ordered and performed today for intra-ocular lens calculations. The surgery will be performed in order to improve vision for driving, reading, and for eye examinations. Recommend phacoemulsification with intra-ocular lens. Recommend Dextenza for post-operative pain and inflammation. Right Eye. Surgery required to correct imbalance of vision. Dilates poorly - shugarcaine by protocol. Malyugin Ring. Omidira.

## 2021-12-20 ENCOUNTER — Encounter (HOSPITAL_COMMUNITY): Payer: Self-pay

## 2021-12-22 ENCOUNTER — Encounter (HOSPITAL_COMMUNITY)
Admission: RE | Admit: 2021-12-22 | Discharge: 2021-12-22 | Disposition: A | Discharge status: Home / Self Care | Payer: Medicare Other | Source: Ambulatory Visit | Attending: Ophthalmology | Admitting: Ophthalmology

## 2021-12-28 MED ORDER — LIDOCAINE HCL 3.5 % OP GEL
OPHTHALMIC | Status: AC
  Filled 2021-12-28: qty 1

## 2021-12-28 MED ORDER — PHENYLEPHRINE HCL 2.5 % OP SOLN
OPHTHALMIC | Status: AC
  Filled 2021-12-28: qty 15

## 2021-12-28 MED ORDER — LIDOCAINE HCL (PF) 1 % IJ SOLN
INTRAMUSCULAR | Status: AC
  Filled 2021-12-28: qty 2

## 2021-12-28 MED ORDER — TETRACAINE HCL 0.5 % OP SOLN
OPHTHALMIC | Status: AC
  Filled 2021-12-28: qty 4

## 2021-12-28 MED ORDER — TROPICAMIDE 1 % OP SOLN
OPHTHALMIC | Status: AC
  Filled 2021-12-28: qty 15

## 2021-12-29 ENCOUNTER — Ambulatory Visit (HOSPITAL_COMMUNITY): Payer: Medicare Other | Admitting: Anesthesiology

## 2021-12-29 ENCOUNTER — Encounter (HOSPITAL_COMMUNITY)
Admission: RE | Disposition: A | Discharge status: Home / Self Care | Payer: Self-pay | Source: Home / Self Care | Attending: Ophthalmology

## 2021-12-29 ENCOUNTER — Ambulatory Visit (HOSPITAL_COMMUNITY)
Admission: RE | Admit: 2021-12-29 | Discharge: 2021-12-29 | Disposition: A | Discharge status: Home / Self Care | Payer: Medicare Other | Attending: Ophthalmology | Admitting: Ophthalmology

## 2021-12-29 ENCOUNTER — Encounter (HOSPITAL_COMMUNITY): Payer: Self-pay | Admitting: Ophthalmology

## 2021-12-29 DIAGNOSIS — H5711 Ocular pain, right eye: Secondary | ICD-10-CM

## 2021-12-29 DIAGNOSIS — I10 Essential (primary) hypertension: Secondary | ICD-10-CM | POA: Insufficient documentation

## 2021-12-29 DIAGNOSIS — H25811 Combined forms of age-related cataract, right eye: Secondary | ICD-10-CM | POA: Insufficient documentation

## 2021-12-29 DIAGNOSIS — M1990 Unspecified osteoarthritis, unspecified site: Secondary | ICD-10-CM

## 2021-12-29 DIAGNOSIS — F0390 Unspecified dementia without behavioral disturbance: Secondary | ICD-10-CM | POA: Diagnosis not present

## 2021-12-29 SURGERY — CATARACT EXTRACTION PHACO AND INTRAOCULAR LENS PLACEMENT (IOC) with placement of Corticosteroid
Anesthesia: Monitor Anesthesia Care | Site: Eye | Laterality: Right

## 2021-12-29 MED ORDER — PHENYLEPHRINE-KETOROLAC 1-0.3 % IO SOLN
INTRAOCULAR | Status: DC | PRN
  Administered 2021-12-29: 500 mL via OPHTHALMIC

## 2021-12-29 MED ORDER — MIDAZOLAM HCL 2 MG/2ML IJ SOLN
INTRAMUSCULAR | Status: DC | PRN
  Administered 2021-12-29: 1 mg via INTRAVENOUS

## 2021-12-29 MED ORDER — TETRACAINE HCL 0.5 % OP SOLN
1.0000 [drp] | OPHTHALMIC | Status: AC | PRN
  Administered 2021-12-29 (×3): 1 [drp] via OPHTHALMIC

## 2021-12-29 MED ORDER — PHENYLEPHRINE-KETOROLAC 1-0.3 % IO SOLN
INTRAOCULAR | Status: AC
  Filled 2021-12-29: qty 4

## 2021-12-29 MED ORDER — BSS IO SOLN
INTRAOCULAR | Status: DC | PRN
  Administered 2021-12-29: 15 mL via INTRAOCULAR

## 2021-12-29 MED ORDER — PHENYLEPHRINE HCL 2.5 % OP SOLN
1.0000 [drp] | OPHTHALMIC | Status: AC | PRN
  Administered 2021-12-29 (×3): 1 [drp] via OPHTHALMIC

## 2021-12-29 MED ORDER — TROPICAMIDE 1 % OP SOLN
1.0000 [drp] | OPHTHALMIC | Status: AC | PRN
  Administered 2021-12-29 (×3): 1 [drp] via OPHTHALMIC

## 2021-12-29 MED ORDER — LIDOCAINE HCL 3.5 % OP GEL: 1.0000 | Freq: Once | OPHTHALMIC | Status: DC

## 2021-12-29 MED ORDER — POVIDONE-IODINE 5 % OP SOLN
OPHTHALMIC | Status: DC | PRN
  Administered 2021-12-29: 1 via OPHTHALMIC

## 2021-12-29 MED ORDER — LIDOCAINE HCL (PF) 1 % IJ SOLN
INTRAOCULAR | Status: DC | PRN
  Administered 2021-12-29: 1 mL via OPHTHALMIC

## 2021-12-29 MED ORDER — MIDAZOLAM HCL 2 MG/2ML IJ SOLN
INTRAMUSCULAR | Status: AC
  Filled 2021-12-29: qty 2

## 2021-12-29 MED ORDER — MOXIFLOXACIN HCL 0.5 % OP SOLN
OPHTHALMIC | Status: DC | PRN
  Administered 2021-12-29: .2 mL via OPHTHALMIC

## 2021-12-29 MED ORDER — DEXAMETHASONE 0.4 MG OP INST
VAGINAL_INSERT | OPHTHALMIC | Status: AC
  Filled 2021-12-29: qty 1

## 2021-12-29 MED ORDER — ORAL CARE MOUTH RINSE: 15.0000 mL | Freq: Once | OROMUCOSAL | Status: DC

## 2021-12-29 MED ORDER — SODIUM HYALURONATE 10 MG/ML IO SOLUTION
PREFILLED_SYRINGE | INTRAOCULAR | Status: DC | PRN
  Administered 2021-12-29 (×2): .85 mL via INTRAOCULAR

## 2021-12-29 MED ORDER — EPINEPHRINE PF 1 MG/ML IJ SOLN
INTRAMUSCULAR | Status: AC
  Filled 2021-12-29: qty 2

## 2021-12-29 MED ORDER — MOXIFLOXACIN HCL 5 MG/ML IO SOLN
INTRAOCULAR | Status: AC
  Filled 2021-12-29: qty 1

## 2021-12-29 MED ORDER — LACTATED RINGERS IV SOLN: INTRAVENOUS | Status: DC

## 2021-12-29 MED ORDER — LIDOCAINE 3.5 % OP GEL OPTIME - NO CHARGE
OPHTHALMIC | Status: DC | PRN
  Administered 2021-12-29: 1 [drp] via OPHTHALMIC

## 2021-12-29 MED ORDER — CHLORHEXIDINE GLUCONATE 0.12 % MT SOLN: 15.0000 mL | Freq: Once | OROMUCOSAL | Status: DC

## 2021-12-29 MED ORDER — DEXAMETHASONE 0.4 MG OP INST
VAGINAL_INSERT | OPHTHALMIC | Status: DC | PRN
  Administered 2021-12-29 (×2): .4 mg via OPHTHALMIC

## 2021-12-29 MED ORDER — STERILE WATER FOR IRRIGATION IR SOLN
Status: DC | PRN
  Administered 2021-12-29: 250 mL

## 2021-12-29 MED ORDER — SODIUM HYALURONATE 23MG/ML IO SOSY
PREFILLED_SYRINGE | INTRAOCULAR | Status: DC | PRN
  Administered 2021-12-29: .6 mL via INTRAOCULAR

## 2021-12-29 SURGICAL SUPPLY — 15 items
CATARACT SUITE SIGHTPATH (MISCELLANEOUS) ×1 IMPLANT
CLOTH BEACON ORANGE TIMEOUT ST (SAFETY) ×1 IMPLANT
EYE SHIELD UNIVERSAL CLEAR (GAUZE/BANDAGES/DRESSINGS) IMPLANT
FEE CATARACT SUITE SIGHTPATH (MISCELLANEOUS) ×1 IMPLANT
GLOVE BIOGEL PI IND STRL 7.0 (GLOVE) ×2 IMPLANT
LENS IOL RAYNER 17.0 (Intraocular Lens) ×1 IMPLANT
LENS IOL RAYONE EMV 17.0 (Intraocular Lens) IMPLANT
NDL HYPO 18GX1.5 BLUNT FILL (NEEDLE) ×1 IMPLANT
NEEDLE HYPO 18GX1.5 BLUNT FILL (NEEDLE) ×1 IMPLANT
PAD ARMBOARD 7.5X6 YLW CONV (MISCELLANEOUS) ×1 IMPLANT
SYR TB 1ML LL NO SAFETY (SYRINGE) ×1 IMPLANT
TAPE SURG TRANSPORE 1 IN (GAUZE/BANDAGES/DRESSINGS) IMPLANT
TAPE SURGICAL TRANSPORE 1 IN (GAUZE/BANDAGES/DRESSINGS) ×1
VISCOELASTIC ADDITIONAL (OPHTHALMIC RELATED) IMPLANT
WATER STERILE IRR 250ML POUR (IV SOLUTION) ×1 IMPLANT

## 2021-12-29 NOTE — Transfer of Care (Signed)
Immediate Anesthesia Transfer of Care Note  Patient: Douglas Reynolds  Procedure(s) Performed: CATARACT EXTRACTION PHACO AND INTRAOCULAR LENS PLACEMENT (IOC) with placement of Corticosteroid (Right: Eye)  Patient Location: Short Stay  Anesthesia Type:MAC  Level of Consciousness: awake, alert , and oriented  Airway & Oxygen Therapy: Patient Spontanous Breathing  Post-op Assessment: Report given to RN and Post -op Vital signs reviewed and stable  Post vital signs: Reviewed and stable  Last Vitals:  Vitals Value Taken Time  BP 123/65 12/29/21 0824  Temp 36.5 C 12/29/21 0824  Pulse 71 12/29/21 0824  Resp 17 12/29/21 0824  SpO2 99 % 12/29/21 0824    Last Pain:  Vitals:   12/29/21 0824  TempSrc: Oral  PainSc: 0-No pain         Complications: No notable events documented.

## 2021-12-29 NOTE — Interval H&P Note (Signed)
History and Physical Interval Note:  12/29/2021 7:44 AM  Douglas Reynolds  has presented today for surgery, with the diagnosis of combined forms age related cataract; right ocular pain and inflammation; right.  The various methods of treatment have been discussed with the patient and family. After consideration of risks, benefits and other options for treatment, the patient has consented to  Procedure(s): CATARACT EXTRACTION PHACO AND INTRAOCULAR LENS PLACEMENT ([place]) with placement of Corticosteroid (Right) as a surgical intervention.  The patient's history has been reviewed, patient examined, no change in status, stable for surgery.  I have reviewed the patient's chart and labs.  Questions were answered to the patient's satisfaction.     Baruch Goldmann

## 2021-12-29 NOTE — Discharge Instructions (Signed)
Please discharge patient when stable, will follow up today with Dr. Simora Dingee at the [facility] office immediately following discharge.  Leave shield in place until visit.  All paperwork with discharge instructions will be given at the office.  [facility] Address:  [address]  [place], [place] [address]  

## 2021-12-29 NOTE — Anesthesia Postprocedure Evaluation (Signed)
Anesthesia Post Note  Patient: HAIDAR MUSE  Procedure(s) Performed: CATARACT EXTRACTION PHACO AND INTRAOCULAR LENS PLACEMENT (IOC) with placement of Corticosteroid (Right: Eye)  Patient location during evaluation: Phase II Anesthesia Type: MAC Level of consciousness: awake and alert and oriented Pain management: pain level controlled Vital Signs Assessment: post-procedure vital signs reviewed and stable Respiratory status: spontaneous breathing, nonlabored ventilation and respiratory function stable Cardiovascular status: blood pressure returned to baseline and stable Postop Assessment: no apparent nausea or vomiting Anesthetic complications: no  No notable events documented.   Last Vitals:  Vitals:   12/29/21 0709 12/29/21 0824  BP: (!) 136/49 123/65  Pulse: 75 71  Resp: 15 17  Temp: 36.8 C 36.5 C  SpO2: 97% 99%    Last Pain:  Vitals:   12/29/21 0824  TempSrc: Oral  PainSc: 0-No pain                 Layton Naves C Ryne Mctigue

## 2021-12-29 NOTE — Op Note (Signed)
Date of procedure: 12/29/21  Pre-operative diagnosis:  Visually significant combined form age-related cataract, Right Eye (H25.811)  Post-operative diagnosis:   1. Visually significant combined form age-related cataract, Right Eye (H25.811) 2. Pain and inflammation following cataract surgery Right Eye (H57.11)  Procedure:  Removal of cataract via phacoemulsification and insertion of intra-ocular lens Rayner RAO200E +17.0D into the capsular bag of the Right Eye 2. Placement of Dextenza insert, Right Eye  Attending surgeon: Gerda Diss. Kino Dunsworth, MD, MA  Anesthesia: MAC, Topical Akten  Complications: None  Estimated Blood Loss: <66m (minimal)  Specimens: None  Implants: As above  Indications:  Visually significant age-related cataract, Right Eye  Procedure:  The patient was seen and identified in the pre-operative area. The operative eye was identified and dilated.  The operative eye was marked.  Topical anesthesia was administered to the operative eye.     The patient was then to the operative suite and placed in the supine position.  A timeout was performed confirming the patient, procedure to be performed, and all other relevant information.   The patient's face was prepped and draped in the usual fashion for intra-ocular surgery.  A lid speculum was placed into the operative eye and the surgical microscope moved into place and focused.  A superotemporal paracentesis was created using a 20 gauge paracentesis blade.  Shugarcaine was injected into the anterior chamber.  Viscoelastic was injected into the anterior chamber.  A temporal clear-corneal main wound incision was created using a 2.479mmicrokeratome.  A continuous curvilinear capsulorrhexis was initiated using an irrigating cystitome and completed using capsulorrhexis forceps.  Hydrodissection and hydrodeliniation were performed.  Viscoelastic was injected into the anterior chamber.  A phacoemulsification handpiece and a chopper as a  second instrument were used to remove the nucleus and epinucleus. The irrigation/aspiration handpiece was used to remove any remaining cortical material.   The capsular bag was reinflated with viscoelastic, checked, and found to be intact.  The intraocular lens was inserted into the capsular bag.  The irrigation/aspiration handpiece was used to remove any remaining viscoelastic.  The clear corneal wound and paracentesis wounds were then hydrated and checked with Weck-Cels to be watertight. 0.88m28mf moxifloxacin was injected into the anterior chamber.  The lid-speculum was removed. The lower punctum was dilated. A Dextenza implant was placed in the lower canaliculus without complication.  The drape was removed.  The patient's face was cleaned with a wet and dry 4x4. A clear shield was taped over the eye. The patient was taken to the post-operative care unit in good condition, having tolerated the procedure well.  Post-Op Instructions: The [facility] and will receive all other orders and instructions.

## 2021-12-29 NOTE — Anesthesia Preprocedure Evaluation (Signed)
Anesthesia Evaluation  Patient identified by MRN, date of birth, ID band Patient awake    Reviewed: Allergy & Precautions, H&P , NPO status , Patient's Chart, lab work & pertinent test results  Airway Mallampati: II  TM Distance: >3 FB Neck ROM: Full    Dental  (+) Dental Advisory Given, Caps   Pulmonary neg pulmonary ROS   Pulmonary exam normal breath sounds clear to auscultation       Cardiovascular Exercise Tolerance: Good hypertension, Pt. on medications Normal cardiovascular exam Rhythm:Regular Rate:Normal     Neuro/Psych  PSYCHIATRIC DISORDERS     Dementia negative neurological ROS     GI/Hepatic negative GI ROS, Neg liver ROS,,,  Endo/Other  negative endocrine ROS    Renal/GU negative Renal ROS  negative genitourinary   Musculoskeletal  (+) Arthritis , Osteoarthritis,    Abdominal   Peds negative pediatric ROS (+)  Hematology negative hematology ROS (+)   Anesthesia Other Findings   Reproductive/Obstetrics negative OB ROS                             Anesthesia Physical Anesthesia Plan  ASA: 2  Anesthesia Plan: MAC   Post-op Pain Management: Minimal or no pain anticipated   Induction: Intravenous  PONV Risk Score and Plan:   Airway Management Planned: Nasal Cannula and Natural Airway  Additional Equipment:   Intra-op Plan:   Post-operative Plan:   Informed Consent: I have reviewed the patients History and Physical, chart, labs and discussed the procedure including the risks, benefits and alternatives for the proposed anesthesia with the patient or authorized representative who has indicated his/her understanding and acceptance.     Dental advisory given  Plan Discussed with: CRNA and Surgeon  Anesthesia Plan Comments:         Anesthesia Quick Evaluation

## 2022-01-19 DIAGNOSIS — Z6824 Body mass index (BMI) 24.0-24.9, adult: Secondary | ICD-10-CM | POA: Diagnosis not present

## 2022-01-19 DIAGNOSIS — M79675 Pain in left toe(s): Secondary | ICD-10-CM | POA: Diagnosis not present

## 2022-01-19 DIAGNOSIS — L401 Generalized pustular psoriasis: Secondary | ICD-10-CM | POA: Diagnosis not present

## 2022-01-19 DIAGNOSIS — M1991 Primary osteoarthritis, unspecified site: Secondary | ICD-10-CM | POA: Diagnosis not present

## 2022-01-19 DIAGNOSIS — L4050 Arthropathic psoriasis, unspecified: Secondary | ICD-10-CM | POA: Diagnosis not present

## 2022-01-19 DIAGNOSIS — M8580 Other specified disorders of bone density and structure, unspecified site: Secondary | ICD-10-CM | POA: Diagnosis not present

## 2022-01-19 DIAGNOSIS — M5136 Other intervertebral disc degeneration, lumbar region: Secondary | ICD-10-CM | POA: Diagnosis not present

## 2022-01-19 DIAGNOSIS — M25551 Pain in right hip: Secondary | ICD-10-CM | POA: Diagnosis not present

## 2022-01-19 DIAGNOSIS — M25552 Pain in left hip: Secondary | ICD-10-CM | POA: Diagnosis not present

## 2022-02-05 DIAGNOSIS — R7303 Prediabetes: Secondary | ICD-10-CM | POA: Diagnosis not present

## 2022-02-05 DIAGNOSIS — E782 Mixed hyperlipidemia: Secondary | ICD-10-CM | POA: Diagnosis not present

## 2022-02-12 DIAGNOSIS — J310 Chronic rhinitis: Secondary | ICD-10-CM | POA: Diagnosis not present

## 2022-02-12 DIAGNOSIS — R634 Abnormal weight loss: Secondary | ICD-10-CM | POA: Diagnosis not present

## 2022-02-12 DIAGNOSIS — E782 Mixed hyperlipidemia: Secondary | ICD-10-CM | POA: Diagnosis not present

## 2022-02-12 DIAGNOSIS — M545 Low back pain, unspecified: Secondary | ICD-10-CM | POA: Diagnosis not present

## 2022-02-12 DIAGNOSIS — L4050 Arthropathic psoriasis, unspecified: Secondary | ICD-10-CM | POA: Diagnosis not present

## 2022-02-12 DIAGNOSIS — R7303 Prediabetes: Secondary | ICD-10-CM | POA: Diagnosis not present

## 2022-02-12 DIAGNOSIS — M25552 Pain in left hip: Secondary | ICD-10-CM | POA: Diagnosis not present

## 2022-02-12 DIAGNOSIS — F03B0 Unspecified dementia, moderate, without behavioral disturbance, psychotic disturbance, mood disturbance, and anxiety: Secondary | ICD-10-CM | POA: Diagnosis not present

## 2022-02-12 DIAGNOSIS — G8929 Other chronic pain: Secondary | ICD-10-CM | POA: Diagnosis not present

## 2022-02-12 DIAGNOSIS — R972 Elevated prostate specific antigen [PSA]: Secondary | ICD-10-CM | POA: Diagnosis not present

## 2022-02-12 DIAGNOSIS — I10 Essential (primary) hypertension: Secondary | ICD-10-CM | POA: Diagnosis not present

## 2022-04-24 DIAGNOSIS — M1991 Primary osteoarthritis, unspecified site: Secondary | ICD-10-CM | POA: Diagnosis not present

## 2022-04-24 DIAGNOSIS — L4050 Arthropathic psoriasis, unspecified: Secondary | ICD-10-CM | POA: Diagnosis not present

## 2022-04-24 DIAGNOSIS — M8580 Other specified disorders of bone density and structure, unspecified site: Secondary | ICD-10-CM | POA: Diagnosis not present

## 2022-04-24 DIAGNOSIS — L401 Generalized pustular psoriasis: Secondary | ICD-10-CM | POA: Diagnosis not present

## 2022-04-24 DIAGNOSIS — Z6824 Body mass index (BMI) 24.0-24.9, adult: Secondary | ICD-10-CM | POA: Diagnosis not present

## 2022-04-24 DIAGNOSIS — M79675 Pain in left toe(s): Secondary | ICD-10-CM | POA: Diagnosis not present

## 2022-04-24 DIAGNOSIS — M25551 Pain in right hip: Secondary | ICD-10-CM | POA: Diagnosis not present

## 2022-04-24 DIAGNOSIS — M25552 Pain in left hip: Secondary | ICD-10-CM | POA: Diagnosis not present

## 2022-04-24 DIAGNOSIS — M5136 Other intervertebral disc degeneration, lumbar region: Secondary | ICD-10-CM | POA: Diagnosis not present

## 2022-08-03 DIAGNOSIS — M5136 Other intervertebral disc degeneration, lumbar region: Secondary | ICD-10-CM | POA: Diagnosis not present

## 2022-08-03 DIAGNOSIS — L401 Generalized pustular psoriasis: Secondary | ICD-10-CM | POA: Diagnosis not present

## 2022-08-03 DIAGNOSIS — L4050 Arthropathic psoriasis, unspecified: Secondary | ICD-10-CM | POA: Diagnosis not present

## 2022-08-03 DIAGNOSIS — M79675 Pain in left toe(s): Secondary | ICD-10-CM | POA: Diagnosis not present

## 2022-08-03 DIAGNOSIS — M25551 Pain in right hip: Secondary | ICD-10-CM | POA: Diagnosis not present

## 2022-08-03 DIAGNOSIS — Z6823 Body mass index (BMI) 23.0-23.9, adult: Secondary | ICD-10-CM | POA: Diagnosis not present

## 2022-08-03 DIAGNOSIS — M8580 Other specified disorders of bone density and structure, unspecified site: Secondary | ICD-10-CM | POA: Diagnosis not present

## 2022-08-03 DIAGNOSIS — M1991 Primary osteoarthritis, unspecified site: Secondary | ICD-10-CM | POA: Diagnosis not present

## 2022-08-03 DIAGNOSIS — M25552 Pain in left hip: Secondary | ICD-10-CM | POA: Diagnosis not present

## 2022-08-07 DIAGNOSIS — R972 Elevated prostate specific antigen [PSA]: Secondary | ICD-10-CM | POA: Diagnosis not present

## 2022-08-07 DIAGNOSIS — R7303 Prediabetes: Secondary | ICD-10-CM | POA: Diagnosis not present

## 2022-08-07 DIAGNOSIS — E782 Mixed hyperlipidemia: Secondary | ICD-10-CM | POA: Diagnosis not present

## 2022-08-13 DIAGNOSIS — R634 Abnormal weight loss: Secondary | ICD-10-CM | POA: Diagnosis not present

## 2022-08-13 DIAGNOSIS — R7303 Prediabetes: Secondary | ICD-10-CM | POA: Diagnosis not present

## 2022-08-13 DIAGNOSIS — R972 Elevated prostate specific antigen [PSA]: Secondary | ICD-10-CM | POA: Diagnosis not present

## 2022-08-13 DIAGNOSIS — R7401 Elevation of levels of liver transaminase levels: Secondary | ICD-10-CM | POA: Diagnosis not present

## 2022-08-13 DIAGNOSIS — E782 Mixed hyperlipidemia: Secondary | ICD-10-CM | POA: Diagnosis not present

## 2022-08-13 DIAGNOSIS — L4050 Arthropathic psoriasis, unspecified: Secondary | ICD-10-CM | POA: Diagnosis not present

## 2022-08-13 DIAGNOSIS — I10 Essential (primary) hypertension: Secondary | ICD-10-CM | POA: Diagnosis not present

## 2022-08-13 DIAGNOSIS — F03B0 Unspecified dementia, moderate, without behavioral disturbance, psychotic disturbance, mood disturbance, and anxiety: Secondary | ICD-10-CM | POA: Diagnosis not present

## 2022-08-13 DIAGNOSIS — M545 Low back pain, unspecified: Secondary | ICD-10-CM | POA: Diagnosis not present

## 2022-08-13 DIAGNOSIS — M791 Myalgia, unspecified site: Secondary | ICD-10-CM | POA: Diagnosis not present

## 2022-08-13 DIAGNOSIS — G8929 Other chronic pain: Secondary | ICD-10-CM | POA: Diagnosis not present

## 2022-09-06 DIAGNOSIS — Z23 Encounter for immunization: Secondary | ICD-10-CM | POA: Diagnosis not present

## 2022-10-15 DIAGNOSIS — Z23 Encounter for immunization: Secondary | ICD-10-CM | POA: Diagnosis not present

## 2022-11-09 DIAGNOSIS — Z6823 Body mass index (BMI) 23.0-23.9, adult: Secondary | ICD-10-CM | POA: Diagnosis not present

## 2022-11-09 DIAGNOSIS — M1991 Primary osteoarthritis, unspecified site: Secondary | ICD-10-CM | POA: Diagnosis not present

## 2022-11-09 DIAGNOSIS — M25551 Pain in right hip: Secondary | ICD-10-CM | POA: Diagnosis not present

## 2022-11-09 DIAGNOSIS — L401 Generalized pustular psoriasis: Secondary | ICD-10-CM | POA: Diagnosis not present

## 2022-11-09 DIAGNOSIS — M79675 Pain in left toe(s): Secondary | ICD-10-CM | POA: Diagnosis not present

## 2022-11-09 DIAGNOSIS — L4050 Arthropathic psoriasis, unspecified: Secondary | ICD-10-CM | POA: Diagnosis not present

## 2022-11-09 DIAGNOSIS — M5136 Other intervertebral disc degeneration, lumbar region with discogenic back pain only: Secondary | ICD-10-CM | POA: Diagnosis not present

## 2022-11-09 DIAGNOSIS — M25552 Pain in left hip: Secondary | ICD-10-CM | POA: Diagnosis not present

## 2022-11-09 DIAGNOSIS — M8580 Other specified disorders of bone density and structure, unspecified site: Secondary | ICD-10-CM | POA: Diagnosis not present

## 2022-11-13 DIAGNOSIS — C44612 Basal cell carcinoma of skin of right upper limb, including shoulder: Secondary | ICD-10-CM | POA: Diagnosis not present

## 2022-11-13 DIAGNOSIS — L570 Actinic keratosis: Secondary | ICD-10-CM | POA: Diagnosis not present

## 2022-11-13 DIAGNOSIS — C44519 Basal cell carcinoma of skin of other part of trunk: Secondary | ICD-10-CM | POA: Diagnosis not present

## 2022-11-13 DIAGNOSIS — X32XXXA Exposure to sunlight, initial encounter: Secondary | ICD-10-CM | POA: Diagnosis not present

## 2022-11-13 DIAGNOSIS — C4441 Basal cell carcinoma of skin of scalp and neck: Secondary | ICD-10-CM | POA: Diagnosis not present

## 2022-11-14 DIAGNOSIS — H43392 Other vitreous opacities, left eye: Secondary | ICD-10-CM | POA: Diagnosis not present

## 2022-12-31 DIAGNOSIS — L570 Actinic keratosis: Secondary | ICD-10-CM | POA: Diagnosis not present

## 2022-12-31 DIAGNOSIS — C4442 Squamous cell carcinoma of skin of scalp and neck: Secondary | ICD-10-CM | POA: Diagnosis not present

## 2022-12-31 DIAGNOSIS — C44519 Basal cell carcinoma of skin of other part of trunk: Secondary | ICD-10-CM | POA: Diagnosis not present

## 2022-12-31 DIAGNOSIS — X32XXXD Exposure to sunlight, subsequent encounter: Secondary | ICD-10-CM | POA: Diagnosis not present

## 2022-12-31 DIAGNOSIS — D0462 Carcinoma in situ of skin of left upper limb, including shoulder: Secondary | ICD-10-CM | POA: Diagnosis not present

## 2022-12-31 DIAGNOSIS — Z85828 Personal history of other malignant neoplasm of skin: Secondary | ICD-10-CM | POA: Diagnosis not present

## 2022-12-31 DIAGNOSIS — Z08 Encounter for follow-up examination after completed treatment for malignant neoplasm: Secondary | ICD-10-CM | POA: Diagnosis not present

## 2023-02-04 DIAGNOSIS — H26492 Other secondary cataract, left eye: Secondary | ICD-10-CM | POA: Diagnosis not present

## 2023-02-05 DIAGNOSIS — E782 Mixed hyperlipidemia: Secondary | ICD-10-CM | POA: Diagnosis not present

## 2023-02-05 DIAGNOSIS — R972 Elevated prostate specific antigen [PSA]: Secondary | ICD-10-CM | POA: Diagnosis not present

## 2023-02-05 DIAGNOSIS — R7303 Prediabetes: Secondary | ICD-10-CM | POA: Diagnosis not present

## 2023-02-11 DIAGNOSIS — Z85828 Personal history of other malignant neoplasm of skin: Secondary | ICD-10-CM | POA: Diagnosis not present

## 2023-02-11 DIAGNOSIS — C44519 Basal cell carcinoma of skin of other part of trunk: Secondary | ICD-10-CM | POA: Diagnosis not present

## 2023-02-11 DIAGNOSIS — Z08 Encounter for follow-up examination after completed treatment for malignant neoplasm: Secondary | ICD-10-CM | POA: Diagnosis not present

## 2023-02-11 DIAGNOSIS — C4441 Basal cell carcinoma of skin of scalp and neck: Secondary | ICD-10-CM | POA: Diagnosis not present

## 2023-02-11 DIAGNOSIS — C44612 Basal cell carcinoma of skin of right upper limb, including shoulder: Secondary | ICD-10-CM | POA: Diagnosis not present

## 2023-02-12 DIAGNOSIS — R972 Elevated prostate specific antigen [PSA]: Secondary | ICD-10-CM | POA: Diagnosis not present

## 2023-02-12 DIAGNOSIS — R7303 Prediabetes: Secondary | ICD-10-CM | POA: Diagnosis not present

## 2023-02-12 DIAGNOSIS — R634 Abnormal weight loss: Secondary | ICD-10-CM | POA: Diagnosis not present

## 2023-02-12 DIAGNOSIS — F03B0 Unspecified dementia, moderate, without behavioral disturbance, psychotic disturbance, mood disturbance, and anxiety: Secondary | ICD-10-CM | POA: Diagnosis not present

## 2023-02-12 DIAGNOSIS — M791 Myalgia, unspecified site: Secondary | ICD-10-CM | POA: Diagnosis not present

## 2023-02-12 DIAGNOSIS — E782 Mixed hyperlipidemia: Secondary | ICD-10-CM | POA: Diagnosis not present

## 2023-02-12 DIAGNOSIS — L4050 Arthropathic psoriasis, unspecified: Secondary | ICD-10-CM | POA: Diagnosis not present

## 2023-02-12 DIAGNOSIS — M545 Low back pain, unspecified: Secondary | ICD-10-CM | POA: Diagnosis not present

## 2023-02-12 DIAGNOSIS — G8929 Other chronic pain: Secondary | ICD-10-CM | POA: Diagnosis not present

## 2023-02-12 DIAGNOSIS — R945 Abnormal results of liver function studies: Secondary | ICD-10-CM | POA: Diagnosis not present

## 2023-02-12 DIAGNOSIS — I10 Essential (primary) hypertension: Secondary | ICD-10-CM | POA: Diagnosis not present

## 2023-02-15 DIAGNOSIS — Z111 Encounter for screening for respiratory tuberculosis: Secondary | ICD-10-CM | POA: Diagnosis not present

## 2023-02-15 DIAGNOSIS — M79675 Pain in left toe(s): Secondary | ICD-10-CM | POA: Diagnosis not present

## 2023-02-15 DIAGNOSIS — M8580 Other specified disorders of bone density and structure, unspecified site: Secondary | ICD-10-CM | POA: Diagnosis not present

## 2023-02-15 DIAGNOSIS — Z6823 Body mass index (BMI) 23.0-23.9, adult: Secondary | ICD-10-CM | POA: Diagnosis not present

## 2023-02-15 DIAGNOSIS — M1991 Primary osteoarthritis, unspecified site: Secondary | ICD-10-CM | POA: Diagnosis not present

## 2023-02-15 DIAGNOSIS — M5136 Other intervertebral disc degeneration, lumbar region with discogenic back pain only: Secondary | ICD-10-CM | POA: Diagnosis not present

## 2023-02-15 DIAGNOSIS — L401 Generalized pustular psoriasis: Secondary | ICD-10-CM | POA: Diagnosis not present

## 2023-02-15 DIAGNOSIS — L4050 Arthropathic psoriasis, unspecified: Secondary | ICD-10-CM | POA: Diagnosis not present

## 2023-02-15 DIAGNOSIS — M25552 Pain in left hip: Secondary | ICD-10-CM | POA: Diagnosis not present

## 2023-02-15 DIAGNOSIS — M25551 Pain in right hip: Secondary | ICD-10-CM | POA: Diagnosis not present

## 2023-03-04 DIAGNOSIS — H26491 Other secondary cataract, right eye: Secondary | ICD-10-CM | POA: Diagnosis not present

## 2023-03-25 DIAGNOSIS — Z85828 Personal history of other malignant neoplasm of skin: Secondary | ICD-10-CM | POA: Diagnosis not present

## 2023-03-25 DIAGNOSIS — Z08 Encounter for follow-up examination after completed treatment for malignant neoplasm: Secondary | ICD-10-CM | POA: Diagnosis not present

## 2023-03-25 DIAGNOSIS — C44519 Basal cell carcinoma of skin of other part of trunk: Secondary | ICD-10-CM | POA: Diagnosis not present

## 2023-03-25 DIAGNOSIS — C44612 Basal cell carcinoma of skin of right upper limb, including shoulder: Secondary | ICD-10-CM | POA: Diagnosis not present

## 2023-05-06 DIAGNOSIS — C44519 Basal cell carcinoma of skin of other part of trunk: Secondary | ICD-10-CM | POA: Diagnosis not present

## 2023-05-06 DIAGNOSIS — X32XXXD Exposure to sunlight, subsequent encounter: Secondary | ICD-10-CM | POA: Diagnosis not present

## 2023-05-06 DIAGNOSIS — L570 Actinic keratosis: Secondary | ICD-10-CM | POA: Diagnosis not present

## 2023-08-06 DIAGNOSIS — Z111 Encounter for screening for respiratory tuberculosis: Secondary | ICD-10-CM | POA: Diagnosis not present

## 2023-08-06 DIAGNOSIS — E782 Mixed hyperlipidemia: Secondary | ICD-10-CM | POA: Diagnosis not present

## 2023-08-06 DIAGNOSIS — R7303 Prediabetes: Secondary | ICD-10-CM | POA: Diagnosis not present

## 2023-08-13 DIAGNOSIS — R634 Abnormal weight loss: Secondary | ICD-10-CM | POA: Diagnosis not present

## 2023-08-13 DIAGNOSIS — F03B0 Unspecified dementia, moderate, without behavioral disturbance, psychotic disturbance, mood disturbance, and anxiety: Secondary | ICD-10-CM | POA: Diagnosis not present

## 2023-08-13 DIAGNOSIS — M791 Myalgia, unspecified site: Secondary | ICD-10-CM | POA: Diagnosis not present

## 2023-08-13 DIAGNOSIS — E782 Mixed hyperlipidemia: Secondary | ICD-10-CM | POA: Diagnosis not present

## 2023-08-13 DIAGNOSIS — I10 Essential (primary) hypertension: Secondary | ICD-10-CM | POA: Diagnosis not present

## 2023-08-13 DIAGNOSIS — R945 Abnormal results of liver function studies: Secondary | ICD-10-CM | POA: Diagnosis not present

## 2023-08-13 DIAGNOSIS — M545 Low back pain, unspecified: Secondary | ICD-10-CM | POA: Diagnosis not present

## 2023-08-13 DIAGNOSIS — G8929 Other chronic pain: Secondary | ICD-10-CM | POA: Diagnosis not present

## 2023-08-13 DIAGNOSIS — L4050 Arthropathic psoriasis, unspecified: Secondary | ICD-10-CM | POA: Diagnosis not present

## 2023-08-13 DIAGNOSIS — R7303 Prediabetes: Secondary | ICD-10-CM | POA: Diagnosis not present

## 2023-08-13 DIAGNOSIS — R972 Elevated prostate specific antigen [PSA]: Secondary | ICD-10-CM | POA: Diagnosis not present

## 2023-08-15 ENCOUNTER — Other Ambulatory Visit (HOSPITAL_COMMUNITY): Payer: Self-pay | Admitting: Internal Medicine

## 2023-08-15 DIAGNOSIS — M810 Age-related osteoporosis without current pathological fracture: Secondary | ICD-10-CM

## 2023-08-15 DIAGNOSIS — Z13820 Encounter for screening for osteoporosis: Secondary | ICD-10-CM

## 2023-08-27 ENCOUNTER — Ambulatory Visit (HOSPITAL_COMMUNITY)
Admission: RE | Admit: 2023-08-27 | Discharge: 2023-08-27 | Disposition: A | Discharge status: Home / Self Care | Source: Ambulatory Visit | Attending: Internal Medicine | Admitting: Internal Medicine

## 2023-08-27 DIAGNOSIS — Z13820 Encounter for screening for osteoporosis: Secondary | ICD-10-CM | POA: Diagnosis not present

## 2023-08-27 DIAGNOSIS — M810 Age-related osteoporosis without current pathological fracture: Secondary | ICD-10-CM | POA: Diagnosis not present

## 2023-08-29 ENCOUNTER — Emergency Department (HOSPITAL_COMMUNITY)

## 2023-08-29 ENCOUNTER — Emergency Department (HOSPITAL_COMMUNITY)
Admission: EM | Admit: 2023-08-29 | Discharge: 2023-08-29 | Disposition: A | Discharge status: Home / Self Care | Attending: Emergency Medicine | Admitting: Emergency Medicine

## 2023-08-29 ENCOUNTER — Other Ambulatory Visit: Payer: Self-pay

## 2023-08-29 DIAGNOSIS — I2782 Chronic pulmonary embolism: Secondary | ICD-10-CM | POA: Diagnosis not present

## 2023-08-29 DIAGNOSIS — Z7901 Long term (current) use of anticoagulants: Secondary | ICD-10-CM | POA: Diagnosis not present

## 2023-08-29 DIAGNOSIS — R0602 Shortness of breath: Secondary | ICD-10-CM | POA: Diagnosis not present

## 2023-08-29 DIAGNOSIS — Z79899 Other long term (current) drug therapy: Secondary | ICD-10-CM | POA: Diagnosis not present

## 2023-08-29 DIAGNOSIS — I10 Essential (primary) hypertension: Secondary | ICD-10-CM | POA: Diagnosis not present

## 2023-08-29 DIAGNOSIS — R200 Anesthesia of skin: Secondary | ICD-10-CM | POA: Insufficient documentation

## 2023-08-29 DIAGNOSIS — R918 Other nonspecific abnormal finding of lung field: Secondary | ICD-10-CM | POA: Diagnosis not present

## 2023-08-29 DIAGNOSIS — I2510 Atherosclerotic heart disease of native coronary artery without angina pectoris: Secondary | ICD-10-CM | POA: Diagnosis not present

## 2023-08-29 DIAGNOSIS — R7989 Other specified abnormal findings of blood chemistry: Secondary | ICD-10-CM | POA: Diagnosis not present

## 2023-08-29 DIAGNOSIS — F0390 Unspecified dementia without behavioral disturbance: Secondary | ICD-10-CM | POA: Diagnosis not present

## 2023-08-29 DIAGNOSIS — I2693 Single subsegmental pulmonary embolism without acute cor pulmonale: Secondary | ICD-10-CM | POA: Diagnosis not present

## 2023-08-29 DIAGNOSIS — R42 Dizziness and giddiness: Secondary | ICD-10-CM | POA: Diagnosis not present

## 2023-08-29 DIAGNOSIS — G459 Transient cerebral ischemic attack, unspecified: Secondary | ICD-10-CM | POA: Diagnosis not present

## 2023-08-29 DIAGNOSIS — R404 Transient alteration of awareness: Secondary | ICD-10-CM | POA: Diagnosis not present

## 2023-08-29 DIAGNOSIS — J449 Chronic obstructive pulmonary disease, unspecified: Secondary | ICD-10-CM | POA: Diagnosis not present

## 2023-08-29 DIAGNOSIS — R0789 Other chest pain: Secondary | ICD-10-CM | POA: Diagnosis present

## 2023-08-29 LAB — CBC WITH DIFFERENTIAL/PLATELET
Abs Immature Granulocytes: 0.03 K/uL (ref 0.00–0.07)
Basophils Absolute: 0.1 K/uL (ref 0.0–0.1)
Basophils Relative: 1 %
Eosinophils Absolute: 0 K/uL (ref 0.0–0.5)
Eosinophils Relative: 0 %
HCT: 46.1 % (ref 39.0–52.0)
Hemoglobin: 14.6 g/dL (ref 13.0–17.0)
Immature Granulocytes: 0 %
Lymphocytes Relative: 15 %
Lymphs Abs: 1.3 K/uL (ref 0.7–4.0)
MCH: 28.5 pg (ref 26.0–34.0)
MCHC: 31.7 g/dL (ref 30.0–36.0)
MCV: 90 fL (ref 80.0–100.0)
Monocytes Absolute: 0.6 K/uL (ref 0.1–1.0)
Monocytes Relative: 7 %
Neutro Abs: 6.6 K/uL (ref 1.7–7.7)
Neutrophils Relative %: 77 %
Platelets: 216 K/uL (ref 150–400)
RBC: 5.12 MIL/uL (ref 4.22–5.81)
RDW: 13.8 % (ref 11.5–15.5)
WBC: 8.5 K/uL (ref 4.0–10.5)
nRBC: 0 % (ref 0.0–0.2)

## 2023-08-29 LAB — COMPREHENSIVE METABOLIC PANEL WITH GFR
ALT: 24 U/L (ref 0–44)
AST: 32 U/L (ref 15–41)
Albumin: 3.9 g/dL (ref 3.5–5.0)
Alkaline Phosphatase: 64 U/L (ref 38–126)
Anion gap: 12 (ref 5–15)
BUN: 14 mg/dL (ref 8–23)
CO2: 20 mmol/L — ABNORMAL LOW (ref 22–32)
Calcium: 9.6 mg/dL (ref 8.9–10.3)
Chloride: 110 mmol/L (ref 98–111)
Creatinine, Ser: 0.85 mg/dL (ref 0.61–1.24)
GFR, Estimated: >60 mL/min (ref >60)
Glucose, Bld: 114 mg/dL — ABNORMAL HIGH (ref 70–99)
Potassium: 3.7 mmol/L (ref 3.5–5.1)
Sodium: 142 mmol/L (ref 135–145)
Total Bilirubin: 0.6 mg/dL (ref 0.0–1.2)
Total Protein: 6.8 g/dL (ref 6.5–8.1)

## 2023-08-29 LAB — URINALYSIS, ROUTINE W REFLEX MICROSCOPIC
Bilirubin Urine: NEGATIVE
Glucose, UA: NEGATIVE mg/dL
Hgb urine dipstick: NEGATIVE
Ketones, ur: NEGATIVE mg/dL
Leukocytes,Ua: NEGATIVE
Nitrite: NEGATIVE
Protein, ur: NEGATIVE mg/dL
Specific Gravity, Urine: 1.008 (ref 1.005–1.030)
pH: 8 (ref 5.0–8.0)

## 2023-08-29 LAB — D-DIMER, QUANTITATIVE: D-Dimer, Quant: 1.03 ug{FEU}/mL — ABNORMAL HIGH (ref 0.00–0.50)

## 2023-08-29 LAB — TROPONIN I (HIGH SENSITIVITY)
Troponin I (High Sensitivity): 3 ng/L (ref <18)
Troponin I (High Sensitivity): 4 ng/L (ref <18)

## 2023-08-29 MED ORDER — IOHEXOL 350 MG/ML SOLN
75.0000 mL | Freq: Once | INTRAVENOUS | Status: AC | PRN
  Administered 2023-08-29: 75 mL via INTRAVENOUS

## 2023-08-29 MED ORDER — APIXABAN (ELIQUIS) VTE STARTER PACK (10MG AND 5MG): ORAL_TABLET | ORAL | 0 refills | Status: AC

## 2023-08-29 NOTE — Discharge Instructions (Addendum)
 We evaluated Douglas Reynolds for his left arm numbness and tremor as well as his shortness of breath and chest pain that he developed in the emergency department.  His neurologic exam in the emergency department was normal and his CT scan of his head was normal.  We discussed his symptoms with the neurologist, who feels that it is safe for him to go home and follow-up with neurology as an outpatient.  We think it is less likely his symptoms are due to a TIA (transient ischemic attack) but would still like him to be followed up with neurology.  Please call [facility] to help arrange the appointment.  We have placed a referral.  We also evaluated him for chest pain.  Testing was positive for a blood clot in his lungs.  The blood clot was small, and may be somewhat old, but we would recommend starting him on a blood thinner.  We have started him on Eliquis .  Please take this as prescribed.  Please have him follow-up with his primary doctor.  This medication can cause bleeding so if he falls and hits his head, he should come back to the emergency department.  He should also come back if he develops any signs of bleeding such as black or bloody stools or increasing weakness.  If there are any other new symptoms such as severe chest pain, difficulty breathing, weakness on one side of the body, vision changes, trouble swallowing, trouble speaking, trouble walking, or any other new symptoms, please bring him back to the emergency department for reassessment.

## 2023-08-29 NOTE — ED Notes (Signed)
Pt able to use urinal.

## 2023-08-29 NOTE — ED Triage Notes (Signed)
 Pt arrived via [facility] c/o dizziness, L arm numbness that has since resolved, shob, and pt states I think I have had a heart attack. Per EMS, pt was in sinus rhythm on their monitor. Also per EMS, pt was orthostatic from laying to sitting. Pt is hard of hearing. Denies any pain at this time

## 2023-08-29 NOTE — ED Provider Notes (Signed)
 [place] EMERGENCY DEPARTMENT AT [facility] Provider Note  CSN: [id number] Arrival date & time: 08/29/23 0840  Chief Complaint(s) Shortness of Breath  HPI Douglas Reynolds is a 78 y.o. male history of psoriatic arthritis, hypertension, hyperlipidemia, dementia presenting for left arm numbness and chest pain.  Patient reports that he had chest pain earlier today, reports it is mild.  Reports that is now resolved.  Reports associated shortness of breath, reported that chest pain radiated to his left arm and had some numbness and tingling that is resolved now currently.  Denies any fevers or chills, cough, headaches, nausea or vomiting, abdominal pain, painful urination.  Paramedics found the patient was slightly orthostatic and gave him IV fluids.   Past Medical History Past Medical History:  Diagnosis Date   Dementia (HCC)    Hyperlipidemia 03/27/2011   Hypertension 03/27/2011   Psoriatic arthritis (HCC) 03/27/2011   Patient Active Problem List   Diagnosis Date Noted   Psoriatic arthritis (HCC) 03/27/2011   Hyperlipidemia 03/27/2011   Hypertension 03/27/2011   Home Medication(s) Prior to Admission medications   Medication Sig Start Date End Date Taking? Authorizing Provider  acetaminophen  (TYLENOL ) 650 MG CR tablet Take 1,300 mg by mouth 2 (two) times daily.   Yes [provider]  APIXABAN  (ELIQUIS ) VTE STARTER PACK (10MG  AND 5MG ) Take as directed on package: start with two-5mg  tablets twice daily for 7 days. On day 8, switch to one-5mg  tablet twice daily. 08/29/23  Yes Francesca Elsie CROME, MD  donepezil (ARICEPT) 10 MG tablet Take 10 mg by mouth at bedtime. 05/11/18  Yes [provider]  ibuprofen (ADVIL) 200 MG tablet Take 400 mg by mouth every 6 (six) hours as needed for moderate pain (pain score 4-6).   Yes [provider]  lisinopril (ZESTRIL) 5 MG tablet Take 5 mg by mouth daily. 11/07/21  Yes [provider]  loratadine  (CLARITIN) 10 MG tablet Take 10 mg by mouth daily.   Yes [provider]  Multiple Vitamins-Minerals (CENTRUM PO) Take by mouth.   Yes [provider]  pravastatin (PRAVACHOL) 40 MG tablet Take 40 mg by mouth daily.   Yes [provider]  predniSONE (DELTASONE) 1 MG tablet Take 3 mg by mouth daily with breakfast. 08/04/15  Yes [provider]  SKYRIZI PEN 150 MG/ML pen Inject 150 mg into the skin every 3 (three) months.   Yes [provider]                                                                                                                                    Past Surgical History Past Surgical History:  Procedure Laterality Date   NO PAST SURGERIES     Family History No family history on file.  Social History Social History   Tobacco Use   Smoking status: Never   Smokeless tobacco: Never  Vaping Use   Vaping  status: Never Used  Substance Use Topics   Alcohol use: Never   Drug use: Never   Allergies Patient has no known allergies.  Review of Systems Review of Systems  All other systems reviewed and are negative.   Physical Exam Vital Signs  I have reviewed the triage vital signs BP 120/65 (BP Location: Left Arm)   Pulse 61   Temp 98 F (36.7 C)   Resp 18   SpO2 95%  Physical Exam Vitals and nursing note reviewed.  Constitutional:      General: He is not in acute distress.    Appearance: Normal appearance.  HENT:     Mouth/Throat:     Mouth: Mucous membranes are moist.  Eyes:     Conjunctiva/sclera: Conjunctivae normal.  Cardiovascular:     Rate and Rhythm: Normal rate and regular rhythm.  Pulmonary:     Effort: Pulmonary effort is normal. Tachypnea present. No respiratory distress.     Breath sounds: Normal breath sounds.  Abdominal:     General: Abdomen is flat.     Palpations: Abdomen is soft.     Tenderness: There is no abdominal tenderness.  Musculoskeletal:     Right lower leg: No edema.      Left lower leg: No edema.  Skin:    General: Skin is warm and dry.     Capillary Refill: Capillary refill takes less than 2 seconds.  Neurological:     Mental Status: He is alert and oriented to person, place, and time. Mental status is at baseline.     Comments: No cranial nerve deficit, strength 5 out of 5 in upper and lower extremities.  No sensory deficit.  Psychiatric:        Mood and Affect: Mood normal.        Behavior: Behavior normal.     ED Results and Treatments Labs (all labs ordered are listed, but only abnormal results are displayed) Labs Reviewed  URINALYSIS, ROUTINE W REFLEX MICROSCOPIC - Abnormal; Notable for the following components:      Result Value   Color, Urine STRAW (*)    All other components within normal limits  COMPREHENSIVE METABOLIC PANEL WITH GFR - Abnormal; Notable for the following components:   CO2 20 (*)    Glucose, Bld 114 (*)    All other components within normal limits  D-DIMER, QUANTITATIVE - Abnormal; Notable for the following components:   D-Dimer, Quant 1.03 (*)    All other components within normal limits  CBC WITH DIFFERENTIAL/PLATELET  TROPONIN I (HIGH SENSITIVITY)  TROPONIN I (HIGH SENSITIVITY)                                                                                                                          Radiology CT Head Wo Contrast Result Date: 08/29/2023 CLINICAL DATA:  Dizziness, left arm numbness, TIA EXAM: CT HEAD WITHOUT CONTRAST TECHNIQUE: Contiguous axial images were obtained from the base of the skull through the  vertex without intravenous contrast. RADIATION DOSE REDUCTION: This exam was performed according to the departmental dose-optimization program which includes automated exposure control, adjustment of the mA and/or kV according to patient size and/or use of iterative reconstruction technique. COMPARISON:  12/09/2017 FINDINGS: Brain: There is atrophy and chronic small vessel disease changes. No acute  intracranial abnormality. Specifically, no hemorrhage, hydrocephalus, mass lesion, acute infarction, or significant intracranial injury. Vascular: No hyperdense vessel or unexpected calcification. Skull: No acute calvarial abnormality. Sinuses/Orbits: No acute findings Other: None IMPRESSION: Atrophy, chronic microvascular disease. No acute intracranial abnormality. Electronically Signed   By: Franky Crease M.D.   On: 08/29/2023 14:44   CT Angio Chest PE W and/or Wo Contrast Addendum Date: 08/29/2023 ADDENDUM REPORT: 08/29/2023 12:41 ADDENDUM: The original report was by Dr. Ryan Salvage. The following addendum is by Dr. Ryan Salvage: These results were called by telephone at the time of interpretation on 08/29/2023 at 12:09 pm to provider [facility] , who verbally acknowledged these results. Electronically Signed   By: Ryan Salvage M.D.   On: 08/29/2023 12:41   Result Date: 08/29/2023 CLINICAL DATA:  Elevated D-dimer. Dizziness. Left arm numbness. Shortness of breath. EXAM: CT ANGIOGRAPHY CHEST WITH CONTRAST TECHNIQUE: Multidetector CT imaging of the chest was performed using the standard protocol during bolus administration of intravenous contrast. Multiplanar CT image reconstructions and MIPs were obtained to evaluate the vascular anatomy. RADIATION DOSE REDUCTION: This exam was performed according to the departmental dose-optimization program which includes automated exposure control, adjustment of the mA and/or kV according to patient size and/or use of iterative reconstruction technique. CONTRAST:  75mL OMNIPAQUE  IOHEXOL  350 MG/ML SOLN COMPARISON:  Chest radiograph 08/29/2023 FINDINGS: Cardiovascular: Peripheral filling defect in lateral segmental and subsegmental right lower lobe pulmonary artery on images 186 through 192 of series 5 favoring chronic embolus, although a posterior subsegmental component is best considered age indeterminate. No other filling defect in the pulmonary  arterial tree identified. Coronary, aortic arch, and branch vessel atherosclerotic vascular disease. Mediastinum/Nodes: Unremarkable Lungs/Pleura: Dependent subsegmental atelectasis in both lower lobes. Very minimal tree-in-bud reticulonodular opacity posteriorly in the right middle lobe. There are several small bulla in the left lower lobe. Upper Abdomen: Abdominal aortic atherosclerosis. Musculoskeletal: Midthoracic spondylosis. Levoconvex thoracic scoliosis. Review of the MIP images confirms the above findings. IMPRESSION: 1. Peripheral filling defect in the lateral segmental and subsegmental right lower lobe pulmonary artery favoring chronic embolus, although a posterior subsegmental component is best considered age indeterminate. Clot burden is very small. No other filling defect in the pulmonary arterial tree identified. 2. Very minimal tree-in-bud reticulonodular opacity posteriorly in the right middle lobe, likely from mild atypical infectious bronchiolitis. 3. Midthoracic spondylosis and levoconvex thoracic scoliosis. 4.  Aortic Atherosclerosis (ICD10-I70.0).  Coronary atherosclerosis. Electronically Signed: By: Ryan Salvage M.D. On: 08/29/2023 12:01   DG Chest Portable 1 View Result Date: 08/29/2023 EXAM: 1 VIEW XRAY OF THE CHEST 08/29/2023 09:30:00 AM COMPARISON: None available. CLINICAL HISTORY: Pt arrived via [facility] c/o dizziness, L arm numbness that has since resolved, shob, and pt states I think I have had a heart attack. Per EMS, pt was in sinus rhythm on their monitor. Also per EMS, pt was orthostatic from lying to sitting. FINDINGS: LUNGS AND PLEURA: No focal pulmonary opacity. No pulmonary edema. No pleural effusion. No pneumothorax. Changes of COPD. HEART AND MEDIASTINUM: No acute abnormality of the cardiac and mediastinal silhouettes. Atherosclerotic plaque. BONES AND SOFT TISSUES: No acute osseous abnormality. IMPRESSION: 1. No acute process. 2. Atherosclerotic plaque and  changes of  COPD. Electronically signed by: Lonni Necessary MD 08/29/2023 10:20 AM EDT RP Workstation: [id number]    Pertinent labs & imaging results that were available during my care of the patient were reviewed by me and considered in my medical decision making (see MDM for details).  Medications Ordered in ED Medications  iohexol  (OMNIPAQUE ) 350 MG/ML injection 75 mL (75 mLs Intravenous Contrast Given 08/29/23 1131)                                                                                                                                     Procedures Procedures  (including critical care time)  Medical Decision Making / ED Course   MDM:  78 year old presenting to the emergency department with chest pain, shortness of breath.  Patient overall well-appearing, physical examination without focal finding.  Given episode of chest pain, differential includes ACS, pneumonia, pneumothorax, chest wall pain, pulmonary embolism.  Will check D-dimer as well as troponin and EKG.  Considered other process such as dissection, patient denies any sudden onset pain and reports pain is very mild, will check chest x-ray.  Differential also includes esophageal process but patient has had no nausea or vomiting to suggest this.  Will reassess.  Clinical Course as of 08/29/23 1546  Thu Aug 29, 2023  1330 Additional history obtained from wife, she reports that the patient first developed numbness in the left arm and felt dizzy.  She thinks he may have had a panic attack.  Was not complaining of chest pain at home.  Results show indeterminate age pulmonary embolism.  Not sure if this would be the cause of the patient's symptoms and it seems small without acute right heart strain, but given finding and earlier chest pain and tachypnea would start eliquis . Given additional weakness will obtain CT head and discuss with neurology  [WS]  1441 Discussed with Dr. Lindzen.  Given symptoms, ABCD score would be 2.  He  thinks it would be reasonable for patient to follow-up outpatient with close neurology follow-up versus admission for TIA workup.  criteria allow for discharge with prompt follow-up.  Will discussed with spouse. [WS]  1544 Discussed with wife and patient, they feel comfortable with discharge and would prefer this.  Have placed referral for neurology for possible TIA although overall concern for TIA is very low.  Also discussed Eliquis  initiation given pulmonary embolism and need for close PMD follow-up, strict ER precautions for signs of bleeding.  Wife feels comfortable with the plan of discharge. Will discharge patient to home. All questions answered. Patient comfortable with plan of discharge. Return precautions discussed with patient and specified on the after visit summary.  [WS]    Clinical Course User Index [WS] Francesca Elsie CROME, MD     Additional history obtained: -Additional history obtained from ems and spouse -External records from outside source obtained and reviewed including: Chart review including previous notes, labs, imaging,  consultation notes including prior notes    Lab Tests: -I ordered, reviewed, and interpreted labs.   The pertinent results include:   Labs Reviewed  URINALYSIS, ROUTINE W REFLEX MICROSCOPIC - Abnormal; Notable for the following components:      Result Value   Color, Urine STRAW (*)    All other components within normal limits  COMPREHENSIVE METABOLIC PANEL WITH GFR - Abnormal; Notable for the following components:   CO2 20 (*)    Glucose, Bld 114 (*)    All other components within normal limits  D-DIMER, QUANTITATIVE - Abnormal; Notable for the following components:   D-Dimer, Quant 1.03 (*)    All other components within normal limits  CBC WITH DIFFERENTIAL/PLATELET  TROPONIN I (HIGH SENSITIVITY)  TROPONIN I (HIGH SENSITIVITY)    Notable for elevated d-dimer   EKG   EKG Interpretation Date/Time:  Thursday August 29 2023 08:52:01  EDT Ventricular Rate:  75 PR Interval:  160 QRS Duration:  109 QT Interval:  403 QTC Calculation: 451 R Axis:   60  Text Interpretation: Sinus rhythm Borderline T abnormalities, lateral leads Confirmed by Francesca Fallow ([id number]) on 08/29/2023 9:47:40 AM         Imaging Studies ordered: I ordered imaging studies including CT Chest, CT head  On my interpretation imaging demonstrates no acute intracranial process, PE  I independently visualized and interpreted imaging. I agree with the radiologist interpretation   Medicines ordered and prescription drug management: Meds ordered this encounter  Medications   iohexol  (OMNIPAQUE ) 350 MG/ML injection 75 mL   APIXABAN  (ELIQUIS ) VTE STARTER PACK (10MG  AND 5MG )    Sig: Take as directed on package: start with two-5mg  tablets twice daily for 7 days. On day 8, switch to one-5mg  tablet twice daily.    Dispense:  74 each    Refill:  0    If starter pack unavailable, substitute with seventy-four 5 mg apixaban  tabs following the above SIG directions.    -I have reviewed the patients home medicines and have made adjustments as needed   Consultations Obtained: I requested consultation with the neurologist,  and discussed lab and imaging findings as well as pertinent plan - they recommend: outpatient follow up   Cardiac Monitoring: The patient was maintained on a cardiac monitor.  I personally viewed and interpreted the cardiac monitored which showed an underlying rhythm of: NSR  Reevaluation: After the interventions noted above, I reevaluated the patient and found that their symptoms have resolved  Co morbidities that complicate the patient evaluation  Past Medical History:  Diagnosis Date   Dementia (HCC)    Hyperlipidemia 03/27/2011   Hypertension 03/27/2011   Psoriatic arthritis (HCC) 03/27/2011      Dispostion: Disposition decision including need for hospitalization was considered, and patient discharged from emergency  department.    Final Clinical Impression(s) / ED Diagnoses Final diagnoses:  Single subsegmental pulmonary embolism without acute cor pulmonale (HCC)  Left arm numbness     This chart was dictated using voice recognition software.  Despite best efforts to proofread,  errors can occur which can change the documentation meaning.    Francesca Fallow CROME, MD 08/29/23 [phone]

## 2023-08-30 DIAGNOSIS — M25552 Pain in left hip: Secondary | ICD-10-CM | POA: Diagnosis not present

## 2023-08-30 DIAGNOSIS — Z111 Encounter for screening for respiratory tuberculosis: Secondary | ICD-10-CM | POA: Diagnosis not present

## 2023-08-30 DIAGNOSIS — M25551 Pain in right hip: Secondary | ICD-10-CM | POA: Diagnosis not present

## 2023-08-30 DIAGNOSIS — L4050 Arthropathic psoriasis, unspecified: Secondary | ICD-10-CM | POA: Diagnosis not present

## 2023-08-30 DIAGNOSIS — M1991 Primary osteoarthritis, unspecified site: Secondary | ICD-10-CM | POA: Diagnosis not present

## 2023-08-30 DIAGNOSIS — M8580 Other specified disorders of bone density and structure, unspecified site: Secondary | ICD-10-CM | POA: Diagnosis not present

## 2023-08-30 DIAGNOSIS — M5136 Other intervertebral disc degeneration, lumbar region with discogenic back pain only: Secondary | ICD-10-CM | POA: Diagnosis not present

## 2023-08-30 DIAGNOSIS — M79675 Pain in left toe(s): Secondary | ICD-10-CM | POA: Diagnosis not present

## 2023-08-30 DIAGNOSIS — L401 Generalized pustular psoriasis: Secondary | ICD-10-CM | POA: Diagnosis not present

## 2023-08-30 DIAGNOSIS — Z6822 Body mass index (BMI) 22.0-22.9, adult: Secondary | ICD-10-CM | POA: Diagnosis not present

## 2023-09-03 DIAGNOSIS — Z7689 Persons encountering health services in other specified circumstances: Secondary | ICD-10-CM | POA: Diagnosis not present

## 2023-09-03 DIAGNOSIS — I2699 Other pulmonary embolism without acute cor pulmonale: Secondary | ICD-10-CM | POA: Diagnosis not present

## 2023-09-03 DIAGNOSIS — Z20822 Contact with and (suspected) exposure to covid-19: Secondary | ICD-10-CM | POA: Diagnosis not present

## 2023-09-03 DIAGNOSIS — R6883 Chills (without fever): Secondary | ICD-10-CM | POA: Diagnosis not present

## 2023-09-03 DIAGNOSIS — Z9989 Dependence on other enabling machines and devices: Secondary | ICD-10-CM | POA: Diagnosis not present

## 2023-09-05 NOTE — Progress Notes (Unsigned)
 Assessment/Plan:  1.  Transient left-sided paresthesia, dizziness, tremor  - I am not really convinced that his host of symptoms represented TIA, but it is possible.  It is very unusual for TIA to cause tremor, and while seizure can happen at the onset of a stroke, it generally does not happen at the onset of a TIA.  In addition, the symptoms had resolved by the time he got to the emergency room (which certainly can happen with a TIA) but they had also changed into chest pain/shortness of breath.  He had been diagnosed with PE, age-indeterminate.  His CT brain was unremarkable.  - Discussed ordering an MRI brain, but I am really not if that would change our treatment and patient and wife both agreed and decided to defer on that.  - We did decide to proceed with carotid ultrasound.  - Patient is already on apixaban .  - He just had his lipids checked on August 5.  His LDL is 102.  We discussed that if one has had a TIA/cerebral infarct, the goal would be to have LDL below 70.  He is on Pravachol.  Primary care is managing that.  Perhaps changing to a different agent such as Crestor could be of value.  I am going to leave that to his primary care as they have a follow-up in December, but did tell his wife and him that I would give this recommendation  - His neurologic exam is nonfocal and nonlateralizing today.  - Discussed signs and symptoms of stroke and if he should experience any of those, should go to emergency room immediately.  2.  PE  -age indeterminate  -just started on eliquis   3.  History of dementia  - This is long-term.  Patient has been on donepezil since 2019  - Wife providing caregiving  4.  Patient will follow-up as needed, as long as his ultrasound is unremarkable.  Subjective:   Douglas Reynolds was seen today in neurologic consultation at the request of Francesca Elsie CROME, MD.  The consultation is for the evaluation of an episode of left-sided numbness/tingling that  led to an ER visit on August 28.  Medical records made available to me are reviewed.  Patient presents today with his wife who supplements the history.  Patient is a 78 year old male with a history of hypertension, hyperlipidemia, and dementia who presented to the ER on August 28 with complaints about chest pain that had radiated to the left arm, with associated numbness.  Wife states that the episode started with numbness, dizziness and tremor of the L arm at 7:28AM.  It was still going on when they called EMS per wife.    It resolved prior to getting to the emergency room per ED doc.  When the ED doc saw the patient the only c/o was CP/SOB.  Workup in the emergency room demonstrated that the patient had an age indeterminant pulmonary embolism.  Patient was started on apixaban .  CT brain done in the emergency room was negative.  I personally reviewed that.  There was atrophy and small vessel disease.  Patient was released and sent here for follow-up for the left arm paresthesias, which had resolved by the time the patient got to the ER.  For a few days after the ED stay, he did have chills and wasn't feeling great (was feeling really badly).  Went to UC and had Covid test and was neg.  No lateralizing weakness with the above event.  Speech was at baseline with the event but pt was moaning during the above event.  No falls during this time.  No trouble with swallowing.  Appetite good except when he was not feeling well and had those chills.     Hx of dementia and on aricept since 2019   ALLERGIES:  No Known Allergies  CURRENT MEDICATIONS:  Outpatient Encounter Medications as of 09/06/2023  Medication Sig   acetaminophen  (TYLENOL ) 650 MG CR tablet Take 1,300 mg by mouth 2 (two) times daily.   APIXABAN  (ELIQUIS ) VTE STARTER PACK (10MG  AND 5MG ) Take as directed on package: start with two-5mg  tablets twice daily for 7 days. On day 8, switch to one-5mg  tablet twice daily.   calcium-vitamin D (OSCAL WITH D)  500-5 MG-MCG tablet Take 1 tablet by mouth in the morning and at bedtime.   donepezil (ARICEPT) 10 MG tablet Take 10 mg by mouth at bedtime.   ibuprofen (ADVIL) 200 MG tablet Take 400 mg by mouth every 6 (six) hours as needed for moderate pain (pain score 4-6).   lisinopril (ZESTRIL) 5 MG tablet Take 5 mg by mouth daily.   loratadine (CLARITIN) 10 MG tablet Take 10 mg by mouth daily.   Multiple Vitamins-Minerals (CENTRUM PO) Take by mouth.   pravastatin (PRAVACHOL) 40 MG tablet Take 40 mg by mouth daily.   predniSONE (DELTASONE) 1 MG tablet Take 3 mg by mouth daily with breakfast.   SKYRIZI PEN 150 MG/ML pen Inject 150 mg into the skin every 3 (three) months.   No facility-administered encounter medications on file as of 09/06/2023.    Objective:   PHYSICAL EXAMINATION:    VITALS:   Vitals:   09/06/23 0954  BP: (!) 147/71  Pulse: 65  SpO2: 96%  Weight: 150 lb 9.6 oz (68.3 kg)  Height: 5' 9 (1.753 m)    GEN:  Normal appears male in no acute distress.  Appears stated age. HEENT:  Normocephalic, atraumatic. The mucous membranes are moist. The superficial temporal arteries are without ropiness or tenderness. Cardiovascular: Regular rate and rhythm. Lungs: Clear to auscultation bilaterally. Neck/Heme: There are no carotid bruits noted bilaterally.  NEUROLOGICAL: Orientation:  The patient is alert and oriented to person and place.  Looks to wife for finer aspects of the history. Cranial nerves: There is good facial symmetry.  Extraocular muscles are intact and visual fields are full to confrontational testing. Speech is fluent and clear but lacks spontaneity. Soft palate rises symmetrically and there is no tongue deviation. Hearing is intact to conversational tone. Tone: Tone is good throughout. Sensation: Sensation is intact to light touch and pinprick throughout (facial, trunk, extremities). Vibration is intact at the bilateral big toe. There is no extinction with double simultaneous  stimulation. There is no sensory dermatomal level identified. Coordination:  The patient has no difficulty with RAM's or FNF bilaterally. Motor: Strength is 5/5 in the bilateral upper and lower extremities.  Shoulder shrug is equal and symmetric. There is no pronator drift.   DTR's: Deep tendon reflexes are 2/4 at the bilateral biceps, triceps, brachioradialis, patella and achilles.  Plantar responses are downgoing bilaterally. Gait and Station: The patient is able to ambulate without difficulty.     Total time spent on today's visit was 45 minutes, including both face-to-face time and nonface-to-face time.  Time included that spent on review of records (prior notes available to me/labs/imaging if pertinent), discussing treatment and goals, answering patient's questions and coordinating care.   Cc:  Shona Norleen PEDLAR,  MD

## 2023-09-06 ENCOUNTER — Ambulatory Visit (INDEPENDENT_AMBULATORY_CARE_PROVIDER_SITE_OTHER): Admitting: Neurology

## 2023-09-06 ENCOUNTER — Encounter: Payer: Self-pay | Admitting: Neurology

## 2023-09-06 VITALS — BP 147/71 | HR 65 | Ht 69.0 in | Wt 150.6 lb

## 2023-09-06 DIAGNOSIS — G459 Transient cerebral ischemic attack, unspecified: Secondary | ICD-10-CM | POA: Diagnosis not present

## 2023-09-06 DIAGNOSIS — G20A1 Parkinson's disease without dyskinesia, without mention of fluctuations: Secondary | ICD-10-CM

## 2023-09-06 DIAGNOSIS — I2699 Other pulmonary embolism without acute cor pulmonale: Secondary | ICD-10-CM

## 2023-09-06 DIAGNOSIS — F02A0 Dementia in other diseases classified elsewhere, mild, without behavioral disturbance, psychotic disturbance, mood disturbance, and anxiety: Secondary | ICD-10-CM

## 2023-09-13 ENCOUNTER — Ambulatory Visit (HOSPITAL_COMMUNITY)
Admission: RE | Admit: 2023-09-13 | Discharge: 2023-09-13 | Disposition: A | Discharge status: Home / Self Care | Source: Ambulatory Visit | Attending: Neurology | Admitting: Neurology

## 2023-09-13 DIAGNOSIS — I6523 Occlusion and stenosis of bilateral carotid arteries: Secondary | ICD-10-CM | POA: Diagnosis not present

## 2023-09-13 DIAGNOSIS — G459 Transient cerebral ischemic attack, unspecified: Secondary | ICD-10-CM | POA: Insufficient documentation

## 2023-09-16 ENCOUNTER — Ambulatory Visit: Payer: Self-pay | Admitting: Neurology

## 2023-10-14 DIAGNOSIS — Z23 Encounter for immunization: Secondary | ICD-10-CM | POA: Diagnosis not present

## 2023-10-21 DIAGNOSIS — Z23 Encounter for immunization: Secondary | ICD-10-CM | POA: Diagnosis not present

## 2023-12-02 DIAGNOSIS — E782 Mixed hyperlipidemia: Secondary | ICD-10-CM | POA: Diagnosis not present

## 2023-12-02 DIAGNOSIS — R7303 Prediabetes: Secondary | ICD-10-CM | POA: Diagnosis not present

## 2023-12-02 DIAGNOSIS — Z13820 Encounter for screening for osteoporosis: Secondary | ICD-10-CM | POA: Diagnosis not present

## 2023-12-06 DIAGNOSIS — E782 Mixed hyperlipidemia: Secondary | ICD-10-CM | POA: Diagnosis not present

## 2023-12-06 DIAGNOSIS — M545 Low back pain, unspecified: Secondary | ICD-10-CM | POA: Diagnosis not present

## 2023-12-06 DIAGNOSIS — F03B0 Unspecified dementia, moderate, without behavioral disturbance, psychotic disturbance, mood disturbance, and anxiety: Secondary | ICD-10-CM | POA: Diagnosis not present

## 2023-12-06 DIAGNOSIS — M810 Age-related osteoporosis without current pathological fracture: Secondary | ICD-10-CM | POA: Diagnosis not present

## 2023-12-06 DIAGNOSIS — L4050 Arthropathic psoriasis, unspecified: Secondary | ICD-10-CM | POA: Diagnosis not present

## 2023-12-06 DIAGNOSIS — I10 Essential (primary) hypertension: Secondary | ICD-10-CM | POA: Diagnosis not present

## 2023-12-06 DIAGNOSIS — M1611 Unilateral primary osteoarthritis, right hip: Secondary | ICD-10-CM | POA: Diagnosis not present

## 2023-12-06 DIAGNOSIS — R202 Paresthesia of skin: Secondary | ICD-10-CM | POA: Diagnosis not present

## 2023-12-06 DIAGNOSIS — Z0001 Encounter for general adult medical examination with abnormal findings: Secondary | ICD-10-CM | POA: Diagnosis not present

## 2023-12-06 DIAGNOSIS — M791 Myalgia, unspecified site: Secondary | ICD-10-CM | POA: Diagnosis not present

## 2023-12-06 DIAGNOSIS — Z86711 Personal history of pulmonary embolism: Secondary | ICD-10-CM | POA: Diagnosis not present

## 2023-12-06 DIAGNOSIS — Z0000 Encounter for general adult medical examination without abnormal findings: Secondary | ICD-10-CM | POA: Diagnosis not present

## 2023-12-09 ENCOUNTER — Other Ambulatory Visit (HOSPITAL_COMMUNITY): Payer: Self-pay | Admitting: Internal Medicine

## 2023-12-09 DIAGNOSIS — M810 Age-related osteoporosis without current pathological fracture: Secondary | ICD-10-CM | POA: Insufficient documentation

## 2023-12-10 ENCOUNTER — Telehealth: Payer: Self-pay

## 2023-12-10 NOTE — Telephone Encounter (Signed)
 Auth Submission: NO AUTH NEEDED Site of care: Site of care: [facility] Payer: medicare a/b, bcbs supp Medication & CPT/J Code(s) submitted: Prolia (Denosumab) [id number] Diagnosis Code:  Route of submission (phone, fax, portal): portal Phone # Fax # Auth type: Buy/Bill HB Units/visits requested: 60mg  q61months x 2 doses Reference number:  Approval from: 12/10/23 to 01/01/24

## 2023-12-16 ENCOUNTER — Encounter: Payer: Self-pay | Admitting: Internal Medicine

## 2023-12-16 ENCOUNTER — Ambulatory Visit

## 2023-12-16 VITALS — BP 158/75 | HR 80 | Temp 97.5°F | Resp 16

## 2023-12-16 DIAGNOSIS — M810 Age-related osteoporosis without current pathological fracture: Secondary | ICD-10-CM | POA: Insufficient documentation

## 2023-12-16 MED ORDER — DENOSUMAB 60 MG/ML ~~LOC~~ SOSY
60.0000 mg | PREFILLED_SYRINGE | Freq: Once | SUBCUTANEOUS | Status: AC
  Administered 2023-12-16: 14:00:00 60 mg via SUBCUTANEOUS

## 2023-12-16 NOTE — Progress Notes (Signed)
 Diagnosis: Osteoporosis  Provider:  Izetta Marshall MD  Procedure: Injection  Prolia (Denosumab), Dose: 60 mg, Site: subcutaneous, Number of injections: 1  Injection Site(s): Right arm  Post Care: Observation period completed  Discharge: Condition: Good, Destination: Home . AVS Provided  Performed by:  Arlina Benjamin, RN

## 2024-01-06 ENCOUNTER — Encounter: Payer: Self-pay | Admitting: Internal Medicine

## 2024-06-15 ENCOUNTER — Ambulatory Visit

## 2024-06-16 ENCOUNTER — Ambulatory Visit
